# Patient Record
Sex: Female | Born: 1995 | Race: White | Hispanic: No | Marital: Married | Smoking: Current every day smoker
Health system: Southern US, Community
[De-identification: ages and names within clinical notes are randomized; demographics above are authoritative.]

## PROBLEM LIST (undated history)

## (undated) ENCOUNTER — Inpatient Hospital Stay (HOSPITAL_COMMUNITY): Payer: Self-pay

## (undated) DIAGNOSIS — F32A Depression, unspecified: Secondary | ICD-10-CM

## (undated) HISTORY — PX: WISDOM TOOTH EXTRACTION: SHX21

## (undated) HISTORY — PX: TONSILLECTOMY AND ADENOIDECTOMY: SHX28

## (undated) HISTORY — PX: NO PAST SURGERIES: SHX2092

## (undated) HISTORY — DX: Depression, unspecified: F32.A

---
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
## 2017-04-10 DIAGNOSIS — Z83511 Family history of glaucoma: Secondary | ICD-10-CM

## 2017-04-10 DIAGNOSIS — H5203 Hypermetropia, bilateral: Secondary | ICD-10-CM | POA: Insufficient documentation

## 2017-04-10 HISTORY — DX: Hypermetropia, bilateral: H52.03

## 2017-04-10 HISTORY — DX: Family history of glaucoma: Z83.511

## 2018-09-10 ENCOUNTER — Encounter: Payer: Self-pay | Admitting: Family

## 2018-11-13 ENCOUNTER — Ambulatory Visit (INDEPENDENT_AMBULATORY_CARE_PROVIDER_SITE_OTHER): Payer: 59 | Admitting: Family Medicine

## 2018-11-13 ENCOUNTER — Encounter: Payer: Self-pay | Admitting: Family Medicine

## 2018-11-13 ENCOUNTER — Other Ambulatory Visit: Payer: Self-pay

## 2018-11-13 VITALS — BP 112/82 | HR 113 | Temp 98.6°F | Ht 62.0 in | Wt 229.0 lb

## 2018-11-13 DIAGNOSIS — J029 Acute pharyngitis, unspecified: Secondary | ICD-10-CM | POA: Diagnosis not present

## 2018-11-13 LAB — POCT RAPID STREP A (OFFICE): Rapid Strep A Screen: NEGATIVE

## 2018-11-13 NOTE — Patient Instructions (Signed)
Continue to push fluids, practice good hand hygiene, and cover your mouth if you cough.  If you start having fevers, shaking or shortness of breath, seek immediate care.  OK to take Tylenol 1000 mg (2 extra strength tabs) or 975 mg (3 regular strength tabs) every 6 hours as needed.  Ibuprofen 400-600 mg (2-3 over the counter strength tabs) every 6 hours as needed for pain.  Give [place] 2 business days to get results of your culture back.   Let us know if you need anything.

## 2018-11-13 NOTE — Addendum Note (Signed)
Addended by: Scharlene Gloss B on: 11/13/2018 01:35 PM   Modules accepted: Orders

## 2018-11-13 NOTE — Progress Notes (Signed)
poct

## 2018-11-13 NOTE — Progress Notes (Signed)
SUBJECTIVE:   Angela Blackwell is a 23 y.o. female presents to the clinic for:  Chief Complaint  Patient presents with  . Sore Throat    Complains of sore throat for 4 days.  Other associated symptoms: sinus congestion, rhinorrhea, sore throat and neck pain.  Denies: sinus pain, itchy watery eyes, ear fullness, ear pain, ear drainage, wheezing, shortness of breath and fevers, cough Sick Contacts: none known Therapy to date: Mucinex, Nyquil, otc allergy medicine  Social History   Tobacco Use  Smoking Status Never Smoker  Smokeless Tobacco Never Used    ROS: Pertinent items are noted in HPI  Patient's medications, allergies, past medical, surgical, social and family histories were reviewed and updated as appropriate.  OBJECTIVE:  BP 112/82 (BP Location: Left Arm, Patient Position: Sitting, Cuff Size: Large)   Pulse (!) 113   Temp 98.6 F (37 C) (Oral)   Ht 5\' 2"  (1.575 m)   Wt 229 lb (103.9 kg)   SpO2 98%   BMI 41.88 kg/m  General: Awake, alert, appearing stated age Eyes: conjunctivae and sclerae clear Ears: normal TMs bilaterally Nose: no visible exudate Oropharynx: lips, mucosa, and tongue normal; teeth and gums normal and tonsils erythematous without exudate Neck: supple, no significant adenopathy or tenderness Lungs: clear to auscultation, no wheezes, rales or rhonchi, symmetric air entry, normal effort Heart: rate and rhythm regular Skin:reveals no rash Psych: Age appropriate judgment and insight  ASSESSMENT/PLAN:  Sore throat - Plan: POCT rapid strep A  Orders as above. Rapid neg, will cx.  Continue to practice good hand hygiene and push fluids. Ibuprofen and acetaminophen for pain. Pt has hx of strep resistant w PCN, did well with macrolide.  F/u prn. Pt voiced understanding and agreement to the plan.  Jilda Roche [place], DO 11/13/18 1:23 PM

## 2018-11-15 ENCOUNTER — Other Ambulatory Visit: Payer: Self-pay | Admitting: Family Medicine

## 2018-11-15 LAB — CULTURE, GROUP A STREP
MICRO NUMBER: 305819
SPECIMEN QUALITY:: ADEQUATE

## 2018-11-15 MED ORDER — AMOXICILLIN 500 MG PO CAPS: 1000.0000 mg | ORAL_CAPSULE | Freq: Every day | ORAL | 0 refills | Status: AC

## 2019-09-05 HISTORY — PX: TONSILLECTOMY: SUR1361

## 2021-04-25 DIAGNOSIS — R87612 Low grade squamous intraepithelial lesion on cytologic smear of cervix (LGSIL): Secondary | ICD-10-CM | POA: Insufficient documentation

## 2021-04-25 HISTORY — DX: Low grade squamous intraepithelial lesion on cytologic smear of cervix (LGSIL): R87.612

## 2021-04-27 LAB — HM PAP SMEAR

## 2021-05-23 HISTORY — PX: CERVICAL BIOPSY: SHX590

## 2021-10-10 ENCOUNTER — Encounter: Payer: Self-pay | Admitting: *Deleted

## 2021-10-28 ENCOUNTER — Ambulatory Visit (INDEPENDENT_AMBULATORY_CARE_PROVIDER_SITE_OTHER): Payer: No Typology Code available for payment source | Admitting: Family Medicine

## 2021-10-28 ENCOUNTER — Encounter: Payer: Self-pay | Admitting: Family Medicine

## 2021-10-28 ENCOUNTER — Other Ambulatory Visit: Payer: Self-pay

## 2021-10-28 ENCOUNTER — Other Ambulatory Visit: Payer: Self-pay | Admitting: Family Medicine

## 2021-10-28 VITALS — BP 126/83 | HR 89 | Temp 98.1°F | Ht 62.5 in | Wt 266.0 lb

## 2021-10-28 DIAGNOSIS — E669 Obesity, unspecified: Secondary | ICD-10-CM | POA: Insufficient documentation

## 2021-10-28 DIAGNOSIS — Z713 Dietary counseling and surveillance: Secondary | ICD-10-CM | POA: Diagnosis not present

## 2021-10-28 DIAGNOSIS — Z6841 Body Mass Index (BMI) 40.0 and over, adult: Secondary | ICD-10-CM

## 2021-10-28 LAB — VITAMIN D 25 HYDROXY (VIT D DEFICIENCY, FRACTURES): VITD: 28.74 ng/mL — ABNORMAL LOW (ref 30.00–100.00)

## 2021-10-28 LAB — COMPREHENSIVE METABOLIC PANEL
ALT: 21 U/L (ref 0–35)
AST: 20 U/L (ref 0–37)
Albumin: 4 g/dL (ref 3.5–5.2)
Alkaline Phosphatase: 50 U/L (ref 39–117)
BUN: 15 mg/dL (ref 6–23)
CO2: 27 mEq/L (ref 19–32)
Calcium: 9.4 mg/dL (ref 8.4–10.5)
Chloride: 102 mEq/L (ref 96–112)
Creatinine, Ser: 0.81 mg/dL (ref 0.40–1.20)
GFR: 102.44 mL/min (ref >60.00)
Glucose, Bld: 87 mg/dL (ref 70–99)
Potassium: 3.9 mEq/L (ref 3.5–5.1)
Sodium: 137 mEq/L (ref 135–145)
Total Bilirubin: 0.4 mg/dL (ref 0.2–1.2)
Total Protein: 6.8 g/dL (ref 6.0–8.3)

## 2021-10-28 LAB — LIPID PANEL
Cholesterol: 165 mg/dL (ref 0–200)
HDL: 44.5 mg/dL (ref >39.00)
LDL Cholesterol: 97 mg/dL (ref 0–99)
NonHDL: 120.93
Total CHOL/HDL Ratio: 4
Triglycerides: 118 mg/dL (ref 0.0–149.0)
VLDL: 23.6 mg/dL (ref 0.0–40.0)

## 2021-10-28 LAB — HEMOGLOBIN A1C: Hgb A1c MFr Bld: 5.4 % (ref 4.6–6.5)

## 2021-10-28 LAB — TSH: TSH: 1.32 u[IU]/mL (ref 0.35–5.50)

## 2021-10-28 MED ORDER — NALTREXONE HCL 50 MG PO TABS: 25.0000 mg | ORAL_TABLET | Freq: Two times a day (BID) | ORAL | 0 refills | Status: DC

## 2021-10-28 NOTE — Patient Instructions (Addendum)
° ° °  provided with online resources for: Weekly net calorie calculator.  Applications for calorie counting.  ensure she is taking in adequate nutrition daily by meeting calorie goals. dietary changes to not only lose weight but to eat healthy.   Low glycemic index -Patient was educated on exercise goal of 150 minutes a week (plus warm up and cool down) of cardiovascular exercise.  HR 150s water consumption of at least 100 ounces a day, more if exercising/sweating.

## 2021-10-28 NOTE — Progress Notes (Signed)
Patient ID: Angela Blackwell, female  DOB: Nov 14, 1995, 26 y.o.   MRN: [id number] Patient Care Team    Relationship Specialty Notifications Start End  Ma Hillock, DO PCP - General Family Medicine  10/28/21   Kerry Dory, NP Nurse Practitioner Nurse Practitioner  10/28/21 10/28/21   Comment: Carolyne Littles, DO Consulting Physician Obstetrics and Gynecology  10/28/21     Chief Complaint  Patient presents with   Establish Care   Obesity    Pt does have a food journal on phone; 1500-1000 calories a day; and have some moderate activity QOD for 30 mins; start weight 285# today 266#    Subjective: Angela Blackwell is a 26 y.o.  female present for new patient establishment-TOC. All past medical history, surgical history, allergies, family history, immunizations, medications and social history were updated in the electronic medical record today. All recent labs, ED visits and hospitalizations within the last year were reviewed.  Today's weight: 266 lbs.  Patient reports she gained weight with the Nexplanon and has had difficulty taking off weight.  She has been overweight for quite a few years and reports she actually has had quite a bit of weight loss slowly over the last couple years.  He works at [employer] and her days consistently active.  She obtains many steps in her day and is lifting heavy objects.  She does not actively exercise for her weight loss. She has been attempting to track calories and reports approximately 1500 cal a day.  Depression screen [facility] 2/9 10/28/2021  Decreased Interest 0  Down, Depressed, Hopeless 1  PHQ - 2 Score 1  Altered sleeping 0  Tired, decreased energy 0  Change in appetite 1  Feeling bad or failure about yourself  2  Trouble concentrating 0  Moving slowly or fidgety/restless 0  Suicidal thoughts 0  PHQ-9 Score 4  Some encounter information is confidential and restricted. Go to Review Flowsheets activity to see all data.   GAD 7 :  Generalized Anxiety Score 10/28/2021  Nervous, Anxious, on Edge 1  Control/stop worrying 1  Worry too much - different things 1  Trouble relaxing 0  Restless 0  Easily annoyed or irritable 1  Afraid - awful might happen 0  Total GAD 7 Score 4         No flowsheet data found.   Immunization History  Administered Date(s) Administered   HPV Quadrivalent 07/17/2012, 09/16/2012, 01/21/2013   Influenza-Unspecified 06/04/2021   Meningococcal Conjugate 06/07/2015   Moderna Sars-Covid-2 Vaccination 10/23/2019, 11/27/2019, 12/25/2019   Tdap 09/04/2012    No results found.  Past Medical History:  Diagnosis Date   Depression    LGSIL on Pap smear of cervix 04/25/2021   [facility]   Allergies  Allergen Reactions   Hydrocodone-Acetaminophen Other (See Comments)    Anxiety    Oxycodone Other (See Comments)    anxiety   Past Surgical History:  Procedure Laterality Date   CERVICAL BIOPSY  05/23/2021   LGSIL- pap 04/2021   TONSILLECTOMY  2021   TONSILLECTOMY AND ADENOIDECTOMY     WISDOM TOOTH EXTRACTION     Family History  Problem Relation Age of Onset   Hyperlipidemia Mother    Miscarriages / Stillbirths Mother        miscarrige   Hypertension Mother    Osteoarthritis Father    Hypertension Father    Diabetes Father    Hyperlipidemia Father    Depression Father  Mental illness Father    Skin cancer Father    ADD / ADHD Half-Brother    PDD Half-Brother    Bipolar disorder Paternal Aunt    Anxiety disorder Paternal Aunt    Anxiety disorder Paternal Aunt    Depression Paternal Aunt    Arthritis Maternal Grandmother    Breast cancer Maternal Grandmother    Arthritis Maternal Grandfather    Prostate cancer Maternal Grandfather    Mental illness Paternal Grandmother    Hypertension Paternal Grandmother    Hyperlipidemia Paternal Grandmother    Asthma Paternal Grandmother    Arthritis Paternal Grandmother    Diabetes Paternal Grandmother    Depression Paternal  Grandmother    Lung cancer Paternal Grandmother        smoker   Cancer Paternal Grandfather        ?   Lung cancer Paternal Grandfather        smoker   Hyperlipidemia Paternal Grandfather    Hearing loss Paternal Grandfather    Hypertension Paternal Grandfather    COPD Paternal Grandfather    Kidney disease Paternal Grandfather    Heart attack Paternal Grandfather    Social History   Social History Narrative   Marital status/children/pets: Single.    Education/employment: HS grad, employed as Chemical engineer     -smoke alarm in the home:Yes     - wears seatbelt: Yes     - Feels safe in their relationships: Yes             Allergies as of 10/28/2021       Reactions   Hydrocodone-acetaminophen Other (See Comments)   Anxiety    Oxycodone Other (See Comments)   anxiety        Medication List        Accurate as of October 28, 2021  2:59 PM. If you have any questions, ask your nurse or doctor.          clotrimazole-betamethasone cream Commonly known as: LOTRISONE clotrimazole-betamethasone 1 %-0.05 % topical cream   Lo Loestrin Fe 1 MG-10 MCG / 10 MCG tablet Generic drug: Norethindrone-Ethinyl Estradiol-Fe Biphas Take 1 tablet by mouth daily.   naltrexone 50 MG tablet Commonly known as: DEPADE Take 0.5 tablets (25 mg total) by mouth 2 (two) times daily before lunch and supper. Started by: Howard Pouch, DO        All past medical history, surgical history, allergies, family history, immunizations andmedications were updated in the EMR today and reviewed under the history and medication portions of their EMR.    No results found for this or any previous visit (from the past 2160 hour(s)).  No results found.   ROS 14 pt review of systems performed and negative (unless mentioned in an HPI)  Objective:  BP 126/83    Pulse 89    Temp 98.1 F (36.7 C) (Oral)    Ht 5' 2.5" (1.588 m)    Wt 266 lb (120.7 kg)    LMP 10/01/2021    SpO2 100%    BMI 47.88  kg/m  Physical Exam Vitals and nursing note reviewed.  Constitutional:      General: She is not in acute distress.    Appearance: Normal appearance. She is obese. She is not ill-appearing or toxic-appearing.  HENT:     Head: Normocephalic and atraumatic.  Eyes:     Extraocular Movements: Extraocular movements intact.     Conjunctiva/sclera: Conjunctivae normal.     Pupils: Pupils are equal,  round, and reactive to light.  Cardiovascular:     Rate and Rhythm: Normal rate and regular rhythm.     Heart sounds: No murmur heard.   No friction rub. No gallop.  Pulmonary:     Effort: Pulmonary effort is normal. No respiratory distress.     Breath sounds: Normal breath sounds. No wheezing or rales.  Musculoskeletal:        General: Normal range of motion.     Cervical back: Neck supple.  Skin:    General: Skin is warm and dry.     Findings: No rash.  Neurological:     Mental Status: She is alert and oriented to person, place, and time. Mental status is at baseline.  Psychiatric:        Mood and Affect: Mood normal.        Behavior: Behavior normal.        Thought Content: Thought content normal.        Judgment: Judgment normal.      Assessment/plan: Angela Blackwell is a 26 y.o. female present for est care-toc  Return in about 4 weeks (around 11/25/2021) for weight loss. Weight loss counseling, encounter for/Morbid obesity (HCC)/BMI 45.0-49.9, adult ([place]) Patient was counseled on exercise, calorie counting, weight loss and potential medications to help with weight loss today. -Patient was provided with online resources for: Weekly net calorie calculator.  Applications for calorie counting.  Patient was advised to ensure she is taking in adequate nutrition daily by meeting calorie goals. -Patient was educated on dietary changes to not only lose weight but to eat healthy.  Patient was educated on glycemic index.   -Goal: To implement low glycemic choices to her diet.  Currently consuming  pretty healthy diet but high in complex carbohydrates. -Patient was educated on exercise goal of 150 minutes a week (plus warm up and cool down) of cardiovascular exercise.  Patient was educated on heart rate for cardiovascular and fat burning zones.  Heart rate at least in the 150s sustained during this 150 minutes/week. -Patient was encouraged to maintain adequate water consumption of at least 100 ounces a day, more if exercising/sweating. -Depade 25 mg twice daily prescribed to help with cravings and snacking. We discussed laboratory evaluation today to rule out diabetes and other comorbid conditions with obesity. We discussed considering Wegovy depending upon those results. - TSH - Vitamin D (25 hydroxy) - Lipid panel - Comp Met (CMET) - Hemoglobin A1c Follow-up 4 weeks   Meds ordered this encounter  Medications   naltrexone (DEPADE) 50 MG tablet    Sig: Take 0.5 tablets (25 mg total) by mouth 2 (two) times daily before lunch and supper.    Dispense:  90 tablet    Refill:  0   Referral Orders  No referral(s) requested today     Note is dictated utilizing voice recognition software. Although note has been proof read prior to signing, occasional typographical errors still can be missed. If any questions arise, please do not hesitate to call for verification.  Electronically signed by: Howard Pouch, DO [place]

## 2021-10-31 ENCOUNTER — Telehealth: Payer: Self-pay | Admitting: Family Medicine

## 2021-10-31 DIAGNOSIS — Z6841 Body Mass Index (BMI) 40.0 and over, adult: Secondary | ICD-10-CM

## 2021-10-31 DIAGNOSIS — E559 Vitamin D deficiency, unspecified: Secondary | ICD-10-CM | POA: Insufficient documentation

## 2021-10-31 MED ORDER — WEGOVY 0.25 MG/0.5ML ~~LOC~~ SOAJ: 0.2500 mg | SUBCUTANEOUS | 5 refills | Status: DC

## 2021-10-31 NOTE — Telephone Encounter (Signed)
Please inform patient the following information: Liver and kidney function are normal. Cholesterol panel is normal. Vitamin D is mildly low at 28> encouraged her to start over-the-counter 600-800 units of vitamin D3 daily. Thyroid function is normal. Glucose and A1c are normal.   As far as additional medications to help with her weight loss, I have called in [facility] which is once weekly injection.  It may be too expensive and not covered by her insurance, we have to submit it in order to be sure. If injection is covered it is a once weekly injection.  If approved, we will instruct her on appropriate technique with her first dose when she picks up first prescription.  If not improved we will continue with the Depade, exercise and dietary changes.  Follow-up in 4 weeks

## 2021-11-01 NOTE — Telephone Encounter (Signed)
KAISLEY STIVERSON ([facility]) Rx #: [id number] ZSWFUX 0.25MG /0.5ML auto-injectors   Form [employer] Electronic PA Form [phone] NCPDP)

## 2021-11-01 NOTE — Telephone Encounter (Signed)
Spoke with pt regarding labs and instructions.   

## 2021-11-09 ENCOUNTER — Ambulatory Visit (INDEPENDENT_AMBULATORY_CARE_PROVIDER_SITE_OTHER): Payer: No Typology Code available for payment source

## 2021-11-09 ENCOUNTER — Other Ambulatory Visit: Payer: Self-pay

## 2021-11-09 DIAGNOSIS — Z7189 Other specified counseling: Secondary | ICD-10-CM | POA: Diagnosis not present

## 2021-11-09 NOTE — Progress Notes (Signed)
Pt came to office today for injection education. ? ?Per PCP instructions: ?I have called in [facility] which is once weekly injection.  If approved, we will instruct her on appropriate technique with her first dose when she picks up first prescription. ?

## 2021-11-23 ENCOUNTER — Ambulatory Visit (INDEPENDENT_AMBULATORY_CARE_PROVIDER_SITE_OTHER): Payer: No Typology Code available for payment source | Admitting: Family Medicine

## 2021-11-23 ENCOUNTER — Other Ambulatory Visit: Payer: Self-pay

## 2021-11-23 ENCOUNTER — Encounter: Payer: Self-pay | Admitting: Family Medicine

## 2021-11-23 VITALS — BP 126/82 | HR 81 | Temp 98.0°F | Wt 260.6 lb

## 2021-11-23 DIAGNOSIS — Z6841 Body Mass Index (BMI) 40.0 and over, adult: Secondary | ICD-10-CM | POA: Diagnosis not present

## 2021-11-23 DIAGNOSIS — E559 Vitamin D deficiency, unspecified: Secondary | ICD-10-CM | POA: Diagnosis not present

## 2021-11-23 MED ORDER — WEGOVY 0.5 MG/0.5ML ~~LOC~~ SOAJ: 0.5000 mg | SUBCUTANEOUS | 5 refills | Status: DC

## 2021-11-23 NOTE — Progress Notes (Signed)
? ? ? ?Patient ID: Angela Blackwell, female  DOB: [birth year], 26 y.o.   MRN: [id number] ?Patient Care Team  ?  Relationship Specialty Notifications Start End  ?Natalia Leatherwood, DO PCP - General Family Medicine  10/28/21   ?Mitchel Honour, DO Consulting Physician Obstetrics and Gynecology  10/28/21   ? ? ?Chief Complaint  ?Patient presents with  ? Weight Loss  ? ? ?Subjective: ?Angela Blackwell is a 26 y.o.  female present for new patient establishment-TOC. ?All past medical history, surgical history, allergies, family history, immunizations, medications and social history were updated in the electronic medical record today. ?All recent labs, ED visits and hospitalizations within the last year were reviewed. ? ?Start weight: 266 lbs.  ?11/23/2021: 260.6 lbs ? ?Pt started on Wegovy since out last visit.  She is tolerating medication well.  She does admit she has a decreased appetite.  The first injection did cause her nausea but she has been tolerating since.  She has started exercising 3 times a week.  She has made cutbacks on her diet following a low glycemic index. ? ?Prior note: ?Patient reports she gained weight with the Nexplanon and has had difficulty taking off weight.  She has been overweight for quite a few years and reports she actually has had quite a bit of weight loss slowly over the last couple years.  SHe works at [employer] and her days consistently active.  She obtains many steps in her day and is lifting heavy objects.  She does not actively exercise for her weight loss. ?She has been attempting to track calories and reports approximately 1500 cal a day. ? ? ?  11/23/2021  ?  3:47 PM 10/28/2021  ? 11:03 AM 07/15/2013  ?  9:20 AM  ?Depression screen PHQ 2/9  ?Decreased Interest 0 0   ?Down, Depressed, Hopeless 1 1   ?PHQ - 2 Score 1 1   ?Altered sleeping  0   ?Tired, decreased energy  0   ?Change in appetite  1   ?Feeling bad or failure about yourself   2   ?Trouble concentrating  0   ?Moving slowly or  fidgety/restless  0   ?Suicidal thoughts  0   ?PHQ-9 Score  4   ?  ? Information is confidential and restricted. Go to Review Flowsheets to unlock data.  ? ? ?  10/28/2021  ? 11:03 AM  ?GAD 7 : Generalized Anxiety Score  ?Nervous, Anxious, on Edge 1  ?Control/stop worrying 1  ?Worry too much - different things 1  ?Trouble relaxing 0  ?Restless 0  ?Easily annoyed or irritable 1  ?Afraid - awful might happen 0  ?Total GAD 7 Score 4  ? ? ? ?  ?  ?   ? View : No data to display.  ?  ?  ?  ? ? ? ?Immunization History  ?Administered Date(s) Administered  ? HPV Quadrivalent 07/17/2012, 09/16/2012, 01/21/2013  ? Influenza-Unspecified 06/04/2021  ? Meningococcal Conjugate 06/07/2015  ? Moderna Sars-Covid-2 Vaccination 10/23/2019, 11/27/2019, 12/25/2019  ? Tdap 09/04/2012  ? ? ?No results found. ? ?Past Medical History:  ?Diagnosis Date  ? Depression   ? Family history of glaucoma 04/10/2017  ? 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 LGSIL on Pap smear of cervix 04/25/2021  ? [facility]  ? Low grade squamous intraepithelial lesion (LGSIL) on cervicovaginal cytologic smear 04/25/2021  ? MDD (major depressive disorder), recurrent, severe, 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  ? PTSD (post-traumatic stress disorder) 07/18/2012  ? ?  Allergies  ?Allergen Reactions  ? Hydrocodone-Acetaminophen Other (See Comments)  ?  Anxiety   ? Oxycodone Other (See Comments)  ?  anxiety  ? ?Past Surgical History:  ?Procedure Laterality Date  ? CERVICAL BIOPSY  05/23/2021  ? LGSIL- pap 04/2021  ? TONSILLECTOMY  2021  ? TONSILLECTOMY AND ADENOIDECTOMY    ? WISDOM TOOTH EXTRACTION    ? ?Family History  ?Problem Relation Age of Onset  ? Hyperlipidemia Mother   ? Misc[place]riages / [place]   ?     miscarrige  ? Hypertension Mother   ? Osteoarthritis Father   ? Hypertension Father   ? Diabetes Father   ? Hyperlipidemia Father   ? Depression Father   ? Mental illness Father   ? Skin cancer Father   ? ADD / ADHD Half-Brother   ? PDD Half-Brother   ? Bipolar  disorder Paternal Aunt   ? Anxiety disorder Paternal Aunt   ? Anxiety disorder Paternal Aunt   ? Depression Paternal Aunt   ? Arthritis Maternal Grandmother   ? Breast cancer Maternal Grandmother   ? Arthritis Maternal Grandfather   ? Prostate cancer Maternal Grandfather   ? Mental illness Paternal Grandmother   ? Hypertension Paternal Grandmother   ? Hyperlipidemia Paternal Grandmother   ? Asthma Paternal Grandmother   ? Arthritis Paternal Grandmother   ? Diabetes Paternal Grandmother   ? Depression Paternal Grandmother   ? Lung cancer Paternal Grandmother   ?     smoker  ? Cancer Paternal Grandfather   ?     ?  ? Lung cancer Paternal Grandfather   ?     smoker  ? Hyperlipidemia Paternal Grandfather   ? Hearing loss Paternal Grandfather   ? Hypertension Paternal Grandfather   ? COPD Paternal Grandfather   ? Kidney disease Paternal Grandfather   ? Heart attack Paternal Grandfather   ? ?Social History  ? ?Social History Narrative  ? Marital status/children/pets: Single.   ? Education/employment: HS grad, employed as IT sales professionalsales associate  ?   -smoke alarm in the home:Yes  ?   - wears seatbelt: Yes  ?   - Feels safe in their relationships: Yes  ?   ?   ?   ? ? ?Allergies as of 11/23/2021   ? ?   Reactions  ? Hydrocodone-acetaminophen Other (See Comments)  ? Anxiety   ? Oxycodone Other (See Comments)  ? anxiety  ? ?  ? ?  ?Medication List  ?  ? ?  ? Accurate as of November 23, 2021  3:48 PM. If you have any questions, ask your nurse or doctor.  ?  ?  ? ?  ? ?clotrimazole-betamethasone cream ?Commonly known as: LOTRISONE ?clotrimazole-betamethasone 1 %-0.05 % topical cream ?  ?Lo Loestrin Fe 1 MG-10 MCG / 10 MCG tablet ?Generic drug: Norethindrone-Ethinyl Estradiol-Fe Biphas ?Take 1 tablet by mouth daily. ?  ?naltrexone 50 MG tablet ?Commonly known as: DEPADE ?Take 0.5 tablets (25 mg total) by mouth 2 (two) times daily before lunch and supper. ?  ?Wegovy 0.25 MG/0.5ML Soaj ?Generic drug: Semaglutide-Weight Management ?Inject 0.25  mg into the skin once a week. ?  ? ?  ? ? ?All past medical history, surgical history, allergies, family history, immunizations andmedications were updated in the EMR today and reviewed under the history and medication portions of their EMR.    ? ?No results found. ? ? ?ROS ?14 pt review of systems performed and negative (unless mentioned in an  HPI) ? ?Objective: ?BP 126/82   Pulse 81   Temp 98 ?F (36.7 ?C)   Wt 260 lb 9.6 oz (118.2 kg)   SpO2 100%   BMI 46.90 kg/m?  ?Physical Exam ?Vitals and nursing note reviewed.  ?Constitutional:   ?   General: She is not in acute distress. ?   Appearance: Normal appearance. She is obese. She is not ill-appearing or toxic-appearing.  ?HENT:  ?   Head: Normocephalic and atraumatic.  ?Eyes:  ?   Extraocular Movements: Extraocular movements intact.  ?   Conjunctiva/sclera: Conjunctivae normal.  ?   Pupils: Pupils are equal, round, and reactive to light.  ?Musculoskeletal:     ?   General: Normal range of motion.  ?Skin: ?   General: Skin is warm and dry.  ?   Findings: No rash.  ?Neurological:  ?   Mental Status: She is alert and oriented to person, place, and time. Mental status is at baseline.  ?Psychiatric:     ?   Mood and Affect: Mood normal.     ?   Behavior: Behavior normal.     ?   Thought Content: Thought content normal.     ?   Judgment: Judgment normal.  ?  ? ? ?Assessment/plan: ?Sheranda Seabrooks is a 27 y.o. female present for est care-toc ? ?No follow-ups on file. ?Weight loss counseling, encounter for/Morbid obesity (HCC)/BMI 45.0-49.9, adult (HCC) ?She has lost little over 6 pounds in 4 weeks. ?Patient has counseled on exercise, calorie counting, weight loss and potential medications  ?-Patient has provided with online resources for: Weekly net calorie calculator.  Applications for calorie counting.  Patient was advised to ensure she is taking in adequate nutrition daily by meeting calorie goals. ?-Patient has been  educated on dietary changes to not only lose weight  but to eat healthy.  Patient was educated on glycemic index. ?  -Goal: Increase exercise to 150 minutes a week. ?-Patient was educated on exercise goal of 150 minutes a week (plus warm up and cool down) of cardiovascu

## 2021-11-23 NOTE — Patient Instructions (Addendum)
Great job!!! ?Goals: increase exercise.  ?More protein at lunch can help cary you through longer.  ? ? ? ? ? ?

## 2021-11-25 ENCOUNTER — Ambulatory Visit: Payer: No Typology Code available for payment source | Admitting: Family Medicine

## 2022-01-06 ENCOUNTER — Encounter: Payer: Self-pay | Admitting: Family Medicine

## 2022-01-06 ENCOUNTER — Ambulatory Visit (INDEPENDENT_AMBULATORY_CARE_PROVIDER_SITE_OTHER): Payer: No Typology Code available for payment source | Admitting: Family Medicine

## 2022-01-06 DIAGNOSIS — Z713 Dietary counseling and surveillance: Secondary | ICD-10-CM | POA: Diagnosis not present

## 2022-01-06 DIAGNOSIS — Z6841 Body Mass Index (BMI) 40.0 and over, adult: Secondary | ICD-10-CM | POA: Diagnosis not present

## 2022-01-06 HISTORY — DX: Dietary counseling and surveillance: Z71.3

## 2022-01-06 MED ORDER — WEGOVY 1 MG/0.5ML ~~LOC~~ SOAJ: 1.0000 mg | SUBCUTANEOUS | 5 refills | Status: DC

## 2022-01-06 MED ORDER — NALTREXONE HCL 50 MG PO TABS: 25.0000 mg | ORAL_TABLET | Freq: Two times a day (BID) | ORAL | 1 refills | Status: DC

## 2022-01-06 MED ORDER — CLOTRIMAZOLE 1 % EX CREA: 1.0000 "application " | TOPICAL_CREAM | Freq: Two times a day (BID) | CUTANEOUS | 5 refills | Status: DC

## 2022-01-06 MED ORDER — LO LOESTRIN FE 1 MG-10 MCG / 10 MCG PO TABS: 1.0000 | ORAL_TABLET | Freq: Every day | ORAL | 4 refills | Status: DC

## 2022-01-06 NOTE — Patient Instructions (Signed)
Great job. Stay STRONG!!! ? ?Increase exercise. AT least 150 minutes a week.  ? ? ?

## 2022-01-06 NOTE — Progress Notes (Signed)
? ? ? ?Patient ID: Angela DallasKylee Polansky, female  DOB: 1996-04-08, 26 y.o.   MRN: [id number] ?Patient Care Team  ?  Relationship Specialty Notifications Start End  ?Natalia LeatherwoodKuneff, Rajean Desantiago A, DO PCP - General Family Medicine  10/28/21   ?Mitchel HonourMorris, Megan, DO Consulting Physician Obstetrics and Gynecology  10/28/21   ? ? ?Chief Complaint  ?Patient presents with  ? Weight Check  ?  Pt is not fasting  ? ? ?Subjective: ?Angela Blackwell is a 26 y.o.  female present for  ?Start weight: 266 lbs.  ?11/23/2021: 260.6 lbs ?01/07/2023: 250 lbs ? ?[facility]  She is now lost about 16 pounds since the start of her journey.  She is exercising more, at about 120 minutes a week.  She reports she is starting to feel the difference since the weight loss.  She is no longer out of breath with activity.  She states it feels good to be able to climb the ladder at work and not feel out of breath when doing so.  She has altered her diet and motivated to stick to it. ? ?Prior note ?Pt started on Wegovy since out last visit.  She is tolerating medication well.  She does admit she has a decreased appetite.  The first injection did cause her nausea but she has been tolerating since.  She has started exercising 3 times a week.  She has made cutbacks on her diet following a low glycemic index. ? ?Prior note: ?Patient reports she gained weight with the Nexplanon and has had difficulty taking off weight.  She has been overweight for quite a few years and reports she actually has had quite a bit of weight loss slowly over the last couple years.  SHe works at [employer] and her days consistently active.  She obtains many steps in her day and is lifting heavy objects.  She does not actively exercise for her weight loss. ?She has been attempting to track calories and reports approximately 1500 cal a day. ? ? ?  11/23/2021  ?  3:47 PM 10/28/2021  ? 11:03 AM 07/15/2013  ?  9:20 AM  ?Depression screen PHQ 2/9  ?Decreased Interest 0 0   ?Down,  Depressed, Hopeless 1 1   ?PHQ - 2 Score 1 1   ?Altered sleeping  0   ?Tired, decreased energy  0   ?Change in appetite  1   ?Feeling bad or failure about yourself   2   ?Trouble concentrating  0   ?Moving slowly or fidgety/restless  0   ?Suicidal thoughts  0   ?PHQ-9 Score  4   ?  ? Information is confidential and restricted. Go to Review Flowsheets to unlock data.  ? ? ?  10/28/2021  ? 11:03 AM  ?GAD 7 : Generalized Anxiety Score  ?Nervous, Anxious, on Edge 1  ?Control/stop worrying 1  ?Worry too much - different things 1  ?Trouble relaxing 0  ?Restless 0  ?Easily annoyed or irritable 1  ?Afraid - awful might happen 0  ?Total GAD 7 Score 4  ? ? ? ?  ?  ?   ? View : No data to display.  ?  ?  ?  ? ? ? ?Immunization History  ?Administered Date(s) Administered  ? HPV Quadrivalent 07/17/2012, 09/16/2012, 01/21/2013  ? Influenza-Unspecified 06/04/2021  ? Meningococcal Conjugate 06/07/2015  ? Moderna Sars-Covid-2 Vaccination 10/23/2019, 11/27/2019, 12/25/2019  ? Tdap 09/04/2012  ? ? ?No results found. ? ?  Past Medical History:  ?Diagnosis Date  ? Depression   ? Family history of glaucoma 04/10/2017  ? 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 LGSIL on Pap smear of cervix 04/25/2021  ? [facility]  ? Low grade squamous intraepithelial lesion (LGSIL) on cervicovaginal cytologic smear 04/25/2021  ? MDD (major depressive disorder), recurrent, severe, 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  ? PTSD (post-traumatic stress disorder) 07/18/2012  ? ?Allergies  ?Allergen Reactions  ? Hydrocodone-Acetaminophen Other (See Comments)  ?  Anxiety   ? Oxycodone Other (See Comments)  ?  anxiety  ? ?Past Surgical History:  ?Procedure Laterality Date  ? CERVICAL BIOPSY  05/23/2021  ? LGSIL- pap 04/2021  ? TONSILLECTOMY  2021  ? TONSILLECTOMY AND ADENOIDECTOMY    ? WISDOM TOOTH EXTRACTION    ? ?Family History  ?Problem Relation Age of Onset  ? Hyperlipidemia Mother   ? Miscarriages / [place] Mother   ?     miscarrige  ? Hypertension Mother   ?  Osteoarthritis Father   ? Hypertension Father   ? Diabetes Father   ? Hyperlipidemia Father   ? Depression Father   ? Mental illness Father   ? Skin cancer Father   ? ADD / ADHD Half-Brother   ? PDD Half-Brother   ? Bipolar disorder Paternal Aunt   ? Anxiety disorder Paternal Aunt   ? Anxiety disorder Paternal Aunt   ? Depression Paternal Aunt   ? Arthritis Maternal Grandmother   ? Breast cancer Maternal Grandmother   ? Arthritis Maternal Grandfather   ? Prostate cancer Maternal Grandfather   ? Mental illness Paternal Grandmother   ? Hypertension Paternal Grandmother   ? Hyperlipidemia Paternal Grandmother   ? Asthma Paternal Grandmother   ? Arthritis Paternal Grandmother   ? Diabetes Paternal Grandmother   ? Depression Paternal Grandmother   ? Lung cancer Paternal Grandmother   ?     smoker  ? Cancer Paternal Grandfather   ?     ?  ? Lung cancer Paternal Grandfather   ?     smoker  ? Hyperlipidemia Paternal Grandfather   ? Hearing loss Paternal Grandfather   ? Hypertension Paternal Grandfather   ? COPD Paternal Grandfather   ? Kidney disease Paternal Grandfather   ? Heart attack Paternal Grandfather   ? ?Social History  ? ?Social History Narrative  ? Marital status/children/pets: Single.   ? Education/employment: HS grad, employed as IT sales professional  ?   -smoke alarm in the home:Yes  ?   - wears seatbelt: Yes  ?   - Feels safe in their relationships: Yes  ?   ?   ?   ? ? ?Allergies as of 01/06/2022   ? ?   Reactions  ? Hydrocodone-acetaminophen Other (See Comments)  ? Anxiety   ? Oxycodone Other (See Comments)  ? anxiety  ? ?  ? ?  ?Medication List  ?  ? ?  ? Accurate as of Jan 06, 2022  6:05 PM. If you have any questions, ask your nurse or doctor.  ?  ?  ? ?  ? ?STOP taking these medications   ? ?Wegovy 0.25 MG/0.5ML Soaj ?Generic drug: Semaglutide-Weight Management ?Replaced by: Wegovy 1 MG/0.5ML Soaj ?Stopped by: Felix Pacini, DO ?  ?Wegovy 0.5 MG/0.5ML Soaj ?Generic drug: Semaglutide-Weight Management ?Stopped by:  Felix Pacini, DO ?  ? ?  ? ?TAKE these medications   ? ?clotrimazole 1 % cream ?Commonly known as: LOTRIMIN ?Apply 1 application. topically 2 (two)  times daily. ?Started by: Felix Pacini, DO ?  ?clotrimazole-betamethasone cream ?Commonly known as: LOTRISONE ?clotrimazole-betamethasone 1 %-0.05 % topical cream ?  ?Lo Loestrin Fe 1 MG-10 MCG / 10 MCG tablet ?Generic drug: Norethindrone-Ethinyl Estradiol-Fe Biphas ?Take 1 tablet by mouth daily. ?  ?naltrexone 50 MG tablet ?Commonly known as: DEPADE ?Take 0.5 tablets (25 mg total) by mouth 2 (two) times daily before lunch and supper. ?  ?Wegovy 1 MG/0.5ML Soaj ?Generic drug: Semaglutide-Weight Management ?Inject 1 mg into the skin once a week. ?Replaces: Wegovy 0.25 MG/0.5ML Soaj ?Started by: Felix Pacini, DO ?  ? ?  ? ? ?All past medical history, surgical history, allergies, family history, immunizations andmedications were updated in the EMR today and reviewed under the history and medication portions of their EMR.    ? ?No results found. ? ? ?ROS ?14 pt review of systems performed and negative (unless mentioned in an HPI) ? ?Objective: ?BP 114/76   Pulse 100   Temp 99.2 ?F (37.3 ?C) (Oral)   Ht 5' 2.5" (1.588 m)   Wt 250 lb (113.4 kg)   LMP 12/25/2021   SpO2 100%   BMI 45.00 kg/m?  ?Physical Exam ?Vitals and nursing note reviewed.  ?Constitutional:   ?   General: She is not in acute distress. ?   Appearance: Normal appearance. She is obese. She is not ill-appearing or toxic-appearing.  ?HENT:  ?   Head: Normocephalic and atraumatic.  ?Eyes:  ?   Extraocular Movements: Extraocular movements intact.  ?   Conjunctiva/sclera: Conjunctivae normal.  ?   Pupils: Pupils are equal, round, and reactive to light.  ?Musculoskeletal:     ?   General: Normal range of motion.  ?Skin: ?   General: Skin is warm and dry.  ?   Findings: No rash.  ?Neurological:  ?   Mental Status: She is alert and oriented to person, place, and time. Mental status is at baseline.  ?Psychiatric:      ?   Mood and Affect: Mood normal.     ?   Behavior: Behavior normal.     ?   Thought Content: Thought content normal.     ?   Judgment: Judgment normal.  ?  ? ? ?Assessment/plan: ?Latarra Eagleton is a 26 y.o. female pre

## 2022-02-15 ENCOUNTER — Encounter: Payer: Self-pay | Admitting: Family Medicine

## 2022-02-15 ENCOUNTER — Ambulatory Visit (INDEPENDENT_AMBULATORY_CARE_PROVIDER_SITE_OTHER): Payer: No Typology Code available for payment source | Admitting: Family Medicine

## 2022-02-15 DIAGNOSIS — Z6841 Body Mass Index (BMI) 40.0 and over, adult: Secondary | ICD-10-CM

## 2022-02-15 DIAGNOSIS — E559 Vitamin D deficiency, unspecified: Secondary | ICD-10-CM

## 2022-02-15 DIAGNOSIS — Z713 Dietary counseling and surveillance: Secondary | ICD-10-CM

## 2022-02-15 MED ORDER — WEGOVY 1.7 MG/0.75ML ~~LOC~~ SOAJ: 1.7000 mg | SUBCUTANEOUS | 5 refills | Status: DC

## 2022-02-15 NOTE — Progress Notes (Signed)
Patient ID: Angela Blackwell, female  DOB: 04-26-1996, 26 y.o.   MRN: [id number] Patient Care Team    Relationship Specialty Notifications Start End  Natalia Leatherwood, DO PCP - General Family Medicine  10/28/21   Mitchel Honour, DO Consulting Physician Obstetrics and Gynecology  10/28/21     Chief Complaint  Patient presents with   Obesity    Subjective: Angela Blackwell is a 26 y.o.  female present for  Start weight: 266 lbs.  11/23/2021: 260.6 lbs 01/07/2023: 250 lbs 02/15/2022: 240 Patient reports she is feeling well.  She is tolerating increased dose of Wegovy of 1 mg weekly injections.  She will be on her fifth injection of this dose at the end of this week.  She is now down a total of 26 pounds.  She has increased her exercise and is hitting the benchmark of at least 150 minutes a week.  She is meal prepping.  She is still taking in high glycemic index carbohydrates. Prior note: Patient reports she is doing great with the [facility].  She is now lost about 16 pounds since the start of her journey.  She is exercising more, at about 120 minutes a week.  She reports she is starting to feel the difference since the weight loss.  She is no longer out of breath with activity.  She states it feels good to be able to climb the ladder at work and not feel out of breath when doing so.  She has altered her diet and motivated to stick to it.  Prior note Pt started on Wegovy since out last visit.  She is tolerating medication well.  She does admit she has a decreased appetite.  The first injection did cause her nausea but she has been tolerating since.  She has started exercising 3 times a week.  She has made cutbacks on her diet following a low glycemic index.  Prior note: Patient reports she gained weight with the Nexplanon and has had difficulty taking off weight.  She has been overweight for quite a few years and reports she actually has had quite a bit of weight loss slowly over the last couple years.  SHe  works at [employer] and her days consistently active.  She obtains many steps in her day and is lifting heavy objects.  She does not actively exercise for her weight loss. She has been attempting to track calories and reports approximately 1500 cal a day.     11/23/2021    3:47 PM 10/28/2021   11:03 AM 07/15/2013    9:20 AM  Depression screen PHQ 2/9  Decreased Interest 0 0   Down, Depressed, Hopeless 1 1   PHQ - 2 Score 1 1   Altered sleeping  0   Tired, decreased energy  0   Change in appetite  1   Feeling bad or failure about yourself   2   Trouble concentrating  0   Moving slowly or fidgety/restless  0   Suicidal thoughts  0   PHQ-9 Score  4      Information is confidential and restricted. Go to Review Flowsheets to unlock data.      10/28/2021   11:03 AM  GAD 7 : Generalized Anxiety Score  Nervous, Anxious, on Edge 1  Control/stop worrying 1  Worry too much - different things 1  Trouble relaxing 0  Restless 0  Easily annoyed or irritable 1  Afraid - awful might happen 0  Total GAD 7 Score 4             No data to display           Immunization History  Administered Date(s) Administered   HPV Quadrivalent 07/17/2012, 09/16/2012, 01/21/2013   Influenza-Unspecified 06/04/2021   Meningococcal Conjugate 06/07/2015   Moderna Sars-Covid-2 Vaccination 10/23/2019, 11/27/2019, 12/25/2019   Tdap 09/04/2012    No results found.  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   PTSD (post-traumatic stress disorder) 07/18/2012   Allergies  Allergen Reactions   Hydrocodone-Acetaminophen Other (See Comments)    Anxiety    Oxycodone Other (See  Comments)    anxiety   Past Surgical History:  Procedure Laterality Date   CERVICAL BIOPSY  05/23/2021   LGSIL- pap 04/2021   TONSILLECTOMY  2021   TONSILLECTOMY AND ADENOIDECTOMY     WISDOM TOOTH EXTRACTION     Family History  Problem Relation Age of Onset   Hyperlipidemia Mother    Miscarriages / Stillbirths Mother        miscarrige   Hypertension Mother    Osteoarthritis Father    Hypertension Father    Diabetes Father    Hyperlipidemia Father    Depression Father    Mental illness Father    Skin cancer Father    ADD / ADHD Half-Brother    PDD Half-Brother    Bipolar disorder Paternal Aunt    Anxiety disorder Paternal Aunt    Anxiety disorder Paternal Aunt    Depression Paternal Aunt    Arthritis Maternal Grandmother    Breast cancer Maternal Grandmother    Arthritis Maternal Grandfather    Prostate cancer Maternal Grandfather    Mental illness Paternal Grandmother    Hypertension Paternal Grandmother    Hyperlipidemia Paternal Grandmother    Asthma Paternal Grandmother    Arthritis Paternal Grandmother    Diabetes Paternal Grandmother    Depression Paternal Grandmother    Lung cancer Paternal Grandmother        smoker   Cancer Paternal Grandfather        ?   Lung cancer Paternal Grandfather        smoker   Hyperlipidemia Paternal Grandfather    Hearing loss Paternal Grandfather    Hypertension Paternal Grandfather    COPD Paternal Grandfather    Kidney disease Paternal Grandfather    Heart attack Paternal Grandfather    Social History   Social History Narrative   Marital status/children/pets: Single.    Education/employment: HS grad, employed as IT sales professionalsales associate     -smoke alarm in the home:Yes     - wears seatbelt: Yes     - Feels safe in their relationships: Yes             Allergies as of 02/15/2022       Reactions   Hydrocodone-acetaminophen Other (See Comments)   Anxiety    Oxycodone Other (See Comments)   anxiety        Medication  List        Accurate as of February 15, 2022  5:08 PM. If you have any questions, ask your nurse or doctor.  STOP taking these medications    Wegovy 1 MG/0.5ML Soaj Generic drug: Semaglutide-Weight Management Replaced by: Wegovy 1.7 MG/0.75ML Soaj Stopped by: Felix Pacini, DO       TAKE these medications    clotrimazole 1 % cream Commonly known as: LOTRIMIN Apply 1 application. topically 2 (two) times daily.   clotrimazole-betamethasone cream Commonly known as: LOTRISONE clotrimazole-betamethasone 1 %-0.05 % topical cream   Lo Loestrin Fe 1 MG-10 MCG / 10 MCG tablet Generic drug: Norethindrone-Ethinyl Estradiol-Fe Biphas Take 1 tablet by mouth daily.   naltrexone 50 MG tablet Commonly known as: DEPADE Take 0.5 tablets (25 mg total) by mouth 2 (two) times daily before lunch and supper.   Wegovy 1.7 MG/0.75ML Soaj Generic drug: Semaglutide-Weight Management Inject 1.7 mg into the skin once a week. Replaces: Wegovy 1 MG/0.5ML Soaj Started by: Felix Pacini, DO        All past medical history, surgical history, allergies, family history, immunizations andmedications were updated in the EMR today and reviewed under the history and medication portions of their EMR.     No results found.   ROS 14 pt review of systems performed and negative (unless mentioned in an HPI)  Objective: BP 137/82   Pulse 92   Temp (!) 97.5 F (36.4 C) (Oral)   Ht 5' 2.5" (1.588 m)   Wt 240 lb (108.9 kg)   SpO2 100%   BMI 43.20 kg/m  Physical Exam Vitals and nursing note reviewed.  Constitutional:      General: She is not in acute distress.    Appearance: Normal appearance. She is obese. She is not ill-appearing or toxic-appearing.  Eyes:     Extraocular Movements: Extraocular movements intact.     Conjunctiva/sclera: Conjunctivae normal.     Pupils: Pupils are equal, round, and reactive to light.  Musculoskeletal:     Right lower leg: No edema.     Left lower leg: No  edema.  Neurological:     Mental Status: She is alert and oriented to person, place, and time. Mental status is at baseline.  Psychiatric:        Mood and Affect: Mood normal.        Behavior: Behavior normal.        Thought Content: Thought content normal.        Judgment: Judgment normal.      Assessment/plan: Angela Blackwell is a 26 y.o. female present for est care-toc  Return in about 7 weeks (around 04/05/2022) for Routine chronic condition follow-up. Weight loss counseling, encounter for/Morbid obesity (HCC)/BMI 45.0-49.9, adult [facility]) She has lost 26 pounds! Patient has been counseled on exercise, calorie counting, weight loss and potential medications  -Patient has been provided with online resources for: Weekly net calorie calculator.  Applications for calorie counting.  Patient was advised to ensure she is taking in adequate nutrition daily by meeting calorie goals. -Patient has been  educated on dietary changes to not only lose weight but to eat healthy.  Patient was educated on glycemic index.  She was reminded again low glycemic index avoid Pasta!  Stick with lean meats, veggies and fruits low glycemic index she is gaining confidence and sticking to her diet.  She is now meal prepping. -Patient was educated on exercise goal of 150 minutes a week (plus warm up and cool down) of cardiovascular exercise.  Patient was educated on heart rate for cardiovascular and fat burning zones.  Heart rate at least in the 150s sustained during this 150  minutes/week. -Patient was encouraged to maintain adequate water consumption of at least 100 ounces a day, more if exercising/sweating. - Continue Depade 25 mg twice daily prescribed to help with cravings and snacking.  - TSH, MD, lipids, CMP and A1c are normal -Wegovy increased to 1.7 mg weekly Follow-up 8 weeks   Meds ordered this encounter  Medications   DISCONTD: Semaglutide-Weight Management (WEGOVY) 1.7 MG/0.75ML SOAJ    Sig: Inject 1.7 mg  into the skin once a week.    Dispense:  3 mL    Refill:  5   WEGOVY 1.7 MG/0.75ML SOAJ    Sig: Inject 1.7 mg into the skin once a week.    Dispense:  3 mL    Refill:  5    DC lower doses please.   Referral Orders  No referral(s) requested today     Note is dictated utilizing voice recognition software. Although note has been proof read prior to signing, occasional typographical errors still can be missed. If any questions arise, please do not hesitate to call for verification.  Electronically signed by: Felix Pacini, DO [facility]

## 2022-02-17 ENCOUNTER — Ambulatory Visit: Payer: No Typology Code available for payment source | Admitting: Family Medicine

## 2022-04-03 ENCOUNTER — Encounter: Payer: Self-pay | Admitting: Family Medicine

## 2022-04-03 ENCOUNTER — Telehealth (INDEPENDENT_AMBULATORY_CARE_PROVIDER_SITE_OTHER): Payer: No Typology Code available for payment source | Admitting: Family Medicine

## 2022-04-03 DIAGNOSIS — U071 COVID-19: Secondary | ICD-10-CM

## 2022-04-03 MED ORDER — ONDANSETRON HCL 4 MG PO TABS: 4.0000 mg | ORAL_TABLET | Freq: Three times a day (TID) | ORAL | 0 refills | Status: DC | PRN

## 2022-04-03 MED ORDER — MOLNUPIRAVIR EUA 200MG CAPSULE: 4.0000 | ORAL_CAPSULE | Freq: Two times a day (BID) | ORAL | 0 refills | Status: AC

## 2022-04-03 MED ORDER — BENZONATATE 200 MG PO CAPS: 200.0000 mg | ORAL_CAPSULE | Freq: Two times a day (BID) | ORAL | 0 refills | Status: DC | PRN

## 2022-04-03 NOTE — Progress Notes (Signed)
VIRTUAL VISIT VIA VIDEO  I connected with Angela Blackwell on 04/03/22 at 10:40 AM EDT by a video enabled telemedicine application and verified that I am speaking with the correct person using two identifiers. Location patient: Home Location provider: [facility], Office Persons participating in the virtual visit: Patient, Dr. Claiborne Billings and Les Pou, CMA  I discussed the limitations of evaluation and management by telemedicine and the availability of in person appointments. The patient expressed understanding and agreed to proceed.    Angela Blackwell , 1996/04/03, 26 y.o., female MRN: [id number] Patient Care Team    Relationship Specialty Notifications Start End  Natalia Leatherwood, DO PCP - General Family Medicine  10/28/21   Mitchel Honour, DO Consulting Physician Obstetrics and Gynecology  10/28/21     Chief Complaint  Patient presents with   Covid Positive    Pt c/o sore throat, body aches, cough, fatigue x 5; pt tested pos for covid on 07/31     Subjective: Pt presents for an OV with complaints of covid + test at home after about 4-5 days of symptom onset. Pt has tried Mucinex, allergy, tylenol to ease their symptoms.      11/23/2021    3:47 PM 10/28/2021   11:03 AM 07/15/2013    9:20 AM  Depression screen PHQ 2/9  Decreased Interest 0 0   Down, Depressed, Hopeless 1 1   PHQ - 2 Score 1 1   Altered sleeping  0   Tired, decreased energy  0   Change in appetite  1   Feeling bad or failure about yourself   2   Trouble concentrating  0   Moving slowly or fidgety/restless  0   Suicidal thoughts  0   PHQ-9 Score  4      Information is confidential and restricted. Go to Review Flowsheets to unlock data.    Allergies  Allergen Reactions   Hydrocodone-Acetaminophen Other (See Comments)    Anxiety    Oxycodone Other (See Comments)    anxiety   Social History   Social History Narrative   Marital status/children/pets: Single.    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   PTSD (post-traumatic stress disorder) 07/18/2012   Past Surgical History:  Procedure Laterality Date   CERVICAL BIOPSY  05/23/2021   LGSIL- pap 04/2021   TONSILLECTOMY  2021   TONSILLECTOMY AND ADENOIDECTOMY     WISDOM TOOTH EXTRACTION     Family History  Problem Relation Age of Onset   Hyperlipidemia Mother    Miscarriages / Stillbirths Mother        miscarrige   Hypertension Mother    Osteoarthritis Father    Hypertension Father    Diabetes Father    Hyperlipidemia Father    Depression Father    Mental illness Father    Skin cancer Father    ADD / ADHD  Half-Brother    PDD Half-Brother    Bipolar disorder Paternal Aunt    Anxiety disorder Paternal Aunt    Anxiety disorder Paternal Aunt    Depression Paternal Aunt    Arthritis Maternal Grandmother    Breast cancer Maternal Grandmother    Arthritis Maternal Grandfather    Prostate cancer Maternal Grandfather    Mental illness Paternal Grandmother    Hypertension Paternal Grandmother    Hyperlipidemia Paternal Grandmother    Asthma Paternal Grandmother    Arthritis Paternal Grandmother    Diabetes Paternal Grandmother    Depression Paternal Grandmother    Lung cancer Paternal Grandmother        smoker   Cancer Paternal Grandfather        ?   Lung cancer Paternal Grandfather        smoker   Hyperlipidemia Paternal Grandfather    Hearing loss Paternal Grandfather    Hypertension Paternal Grandfather    COPD Paternal  Grandfather    Kidney disease Paternal Grandfather    Heart attack Paternal Grandfather    Allergies as of 04/03/2022       Reactions   Hydrocodone-acetaminophen Other (See Comments)   Anxiety    Oxycodone Other (See Comments)   anxiety        Medication List        Accurate as of April 03, 2022 11:27 AM. If you have any questions, ask your nurse or doctor.          benzonatate 200 MG capsule Commonly known as: TESSALON Take 1 capsule (200 mg total) by mouth 2 (two) times daily as needed for cough. Started by: Felix Pacini, DO   clotrimazole 1 % cream Commonly known as: LOTRIMIN Apply 1 application. topically 2 (two) times daily.   clotrimazole-betamethasone cream Commonly known as: LOTRISONE clotrimazole-betamethasone 1 %-0.05 % topical cream   Lo Loestrin Fe 1 MG-10 MCG / 10 MCG tablet Generic drug: Norethindrone-Ethinyl Estradiol-Fe Biphas Take 1 tablet by mouth daily.   molnupiravir EUA 200 mg Caps capsule Commonly known as: LAGEVRIO Take 4 capsules (800 mg total) by mouth 2 (two) times daily for 5 days. Started by: Felix Pacini, DO   naltrexone 50 MG tablet Commonly known as: DEPADE Take 0.5 tablets (25 mg total) by mouth 2 (two) times daily before lunch and supper.   ondansetron 4 MG tablet Commonly known as: ZOFRAN Take 1 tablet (4 mg total) by mouth every 8 (eight) hours as needed for nausea or vomiting. Started by: Felix Pacini, DO   Wegovy 1.7 MG/0.75ML Soaj Generic drug: Semaglutide-Weight Management Inject 1.7 mg into the skin once a week.        All past medical history, surgical history, allergies, family history, immunizations andmedications were updated in the EMR today and reviewed under the history and medication portions of their EMR.     Review of Systems  Constitutional:  Positive for fever and malaise/fatigue. Negative for chills and diaphoresis.  HENT:  Positive for congestion, sinus pain and sore throat. Negative for ear  discharge, ear pain and nosebleeds.   Eyes:  Negative for pain and discharge.  Respiratory:  Positive for cough and sputum production. Negative for shortness of breath and wheezing.   Gastrointestinal:  Positive for diarrhea, nausea and vomiting.  Musculoskeletal:  Positive for myalgias.  Skin:  Negative for itching and rash.  Neurological:  Positive for headaches.   Negative, with the exception of above mentioned in HPI   Objective:  LMP 03/23/2022  There is no height or weight on file to calculate BMI. Physical Exam Vitals and nursing note reviewed.  Constitutional:      General: She is not in acute distress.    Appearance: Normal appearance. She is normal weight. She is not ill-appearing or toxic-appearing.  Eyes:     Extraocular Movements: Extraocular movements intact.     Conjunctiva/sclera: Conjunctivae normal.     Pupils: Pupils are equal, round, and reactive to light.  Pulmonary:     Effort: Pulmonary effort is normal.  Skin:    Findings: No rash.  Neurological:     Mental Status: She is alert and oriented to person, place, and time. Mental status is at baseline.  Psychiatric:        Mood and Affect: Mood normal.        Behavior: Behavior normal.        Thought Content: Thought content normal.        Judgment: Judgment normal.     No results found. No results found. No results found for this or any previous visit (from the past 24 hour(s)).  Assessment/Plan: Tineka Uriegas is a 26 y.o. female present for OV for  COVID-19 virus infection Rest, hydrate.  +/- flonase, mucinex (DM if cough), nettie pot or nasal saline.  Molnuprivir, tessalon perles, zofran prescribed, take until completed.  If cough present it can last up to 6-8 weeks.  Pt will be set up for covid testing in office per employer request F/U 2 weeks if not improved.  Reviewed home care instructions for COVID. Advised self-isolation at home for at least 5 days. After 5 days, if improved and fever  resolved, can be in public, but should wear a mask around others for an additional 5 days. If symptoms, esp, dyspnea develops/worsens, recommend in-person evaluation at either an urgent care or the emergency room.    Reviewed expectations re: course of current medical issues. Discussed self-management of symptoms. Outlined signs and symptoms indicating need for more acute intervention. Patient verbalized understanding and all questions were answered. Patient received an After-Visit Summary.    No orders of the defined types were placed in this encounter.  Meds ordered this encounter  Medications   molnupiravir EUA (LAGEVRIO) 200 mg CAPS capsule    Sig: Take 4 capsules (800 mg total) by mouth 2 (two) times daily for 5 days.    Dispense:  40 capsule    Refill:  0   ondansetron (ZOFRAN) 4 MG tablet    Sig: Take 1 tablet (4 mg total) by mouth every 8 (eight) hours as needed for nausea or vomiting.    Dispense:  20 tablet    Refill:  0   benzonatate (TESSALON) 200 MG capsule    Sig: Take 1 capsule (200 mg total) by mouth 2 (two) times daily as needed for cough.    Dispense:  20 capsule    Refill:  0   Referral Orders  No referral(s) requested today     Note is dictated utilizing voice recognition software. Although note has been proof read prior to signing, occasional typographical errors still can be missed. If any questions arise, please do not hesitate to call for verification.   electronically signed by:  Felix Pacini, DO  [facility] [place]

## 2022-04-03 NOTE — Patient Instructions (Signed)
10 Things You Can Do to Manage Your COVID-19 Symptoms at Home ?If you have possible or confirmed COVID-19 ?Stay home except to get medical care. ?Monitor your symptoms carefully. If your symptoms get worse, call your healthcare provider immediately. ?Get rest and stay hydrated. ?If you have a medical appointment, call the healthcare provider ahead of time and tell them that you have or may have COVID-19. ?For medical emergencies, call 911 and notify the dispatch personnel that you have or may have COVID-19. ?Cover your cough and sneezes with a tissue or use the inside of your elbow. ?Wash your hands often with soap and water for at least 20 seconds or clean your hands with an alcohol-based hand sanitizer that contains at least 60% alcohol. ?As much as possible, stay in a specific room and away from other people in your home. Also, you should use a separate bathroom, if available. If you need to be around other people in or outside of the home, wear a mask. ?Avoid sharing personal items with other people in your household, like dishes, towels, and bedding. ?Clean all surfaces that are touched often, like counters, tabletops, and doorknobs. Use household cleaning sprays or wipes according to the label instructions. ?cdc.gov/coronavirus ?03/19/2020 ?This information is not intended to replace advice given to you by your health care provider. Make sure you discuss any questions you have with your health care provider. ?Document Revised: 05/13/2021 Document Reviewed: 05/13/2021 ?Elsevier Patient Education ? 2022 [employer] ? ?

## 2022-04-07 ENCOUNTER — Ambulatory Visit: Payer: No Typology Code available for payment source | Admitting: Family Medicine

## 2022-04-14 ENCOUNTER — Ambulatory Visit (INDEPENDENT_AMBULATORY_CARE_PROVIDER_SITE_OTHER): Payer: No Typology Code available for payment source | Admitting: Family Medicine

## 2022-04-14 ENCOUNTER — Encounter: Payer: Self-pay | Admitting: Family Medicine

## 2022-04-14 VITALS — BP 104/71 | HR 91 | Temp 98.2°F | Ht 62.5 in | Wt 220.0 lb

## 2022-04-14 DIAGNOSIS — E669 Obesity, unspecified: Secondary | ICD-10-CM

## 2022-04-14 DIAGNOSIS — Z713 Dietary counseling and surveillance: Secondary | ICD-10-CM | POA: Diagnosis not present

## 2022-04-14 MED ORDER — ALBUTEROL SULFATE HFA 108 (90 BASE) MCG/ACT IN AERS: 2.0000 | INHALATION_SPRAY | Freq: Four times a day (QID) | RESPIRATORY_TRACT | 0 refills | Status: DC | PRN

## 2022-04-14 MED ORDER — NALTREXONE HCL 50 MG PO TABS: 25.0000 mg | ORAL_TABLET | Freq: Two times a day (BID) | ORAL | 1 refills | Status: DC

## 2022-04-14 NOTE — Progress Notes (Signed)
Patient ID: Angela Blackwell, female  DOB: 1995/11/19, 26 y.o.   MRN: [id number] Patient Care Team    Relationship Specialty Notifications Start End  Natalia Leatherwood, DO PCP - General Family Medicine  10/28/21   Mitchel Honour, DO Consulting Physician Obstetrics and Gynecology  10/28/21     Chief Complaint  Patient presents with   Obesity    Subjective: Angela Blackwell is a 26 y.o.  female present for  Start weight: 266 lbs.  11/23/2021: 260.6 lbs 01/07/2023: 250 lbs 02/15/2022: 240 04/14/2022:220 Pt reports she is feeling great. She has lost another 20lbs. (46 lbs total). She is exercising and making dietary changes.  Prior note Patient reports she is feeling well.  She is tolerating increased dose of Wegovy of 1 mg weekly injections.  She will be on her fifth injection of this dose at the end of this week.  She is now down a total of 26 pounds.  She has increased her exercise and is hitting the benchmark of at least 150 minutes a week.  She is meal prepping.  She is still taking in high glycemic index carbohydrates. Prior note: Patient reports she is doing great with the [facility].  She is now lost about 16 pounds since the start of her journey.  She is exercising more, at about 120 minutes a week.  She reports she is starting to feel the difference since the weight loss.  She is no longer out of breath with activity.  She states it feels good to be able to climb the ladder at work and not feel out of breath when doing so.  She has altered her diet and motivated to stick to it.  Prior note Pt started on Wegovy since out last visit.  She is tolerating medication well.  She does admit she has a decreased appetite.  The first injection did cause her nausea but she has been tolerating since.  She has started exercising 3 times a week.  She has made cutbacks on her diet following a low glycemic index.  Prior note: Patient reports she gained weight with the Nexplanon and has had difficulty taking off  weight.  She has been overweight for quite a few years and reports she actually has had quite a bit of weight loss slowly over the last couple years.  SHe works at [employer] and her days consistently active.  She obtains many steps in her day and is lifting heavy objects.  She does not actively exercise for her weight loss. She has been attempting to track calories and reports approximately 1500 cal a day.     11/23/2021    3:47 PM 10/28/2021   11:03 AM 07/15/2013    9:20 AM  Depression screen PHQ 2/9  Decreased Interest 0 0   Down, Depressed, Hopeless 1 1   PHQ - 2 Score 1 1   Altered sleeping  0   Tired, decreased energy  0   Change in appetite  1   Feeling bad or failure about yourself   2   Trouble concentrating  0   Moving slowly or fidgety/restless  0   Suicidal thoughts  0   PHQ-9 Score  4      Information is confidential and restricted. Go to Review Flowsheets to unlock data.      10/28/2021   11:03 AM  GAD 7 : Generalized Anxiety Score  Nervous, Anxious, on Edge 1  Control/stop worrying 1  Worry too  much - different things 1  Trouble relaxing 0  Restless 0  Easily annoyed or irritable 1  Afraid - awful might happen 0  Total GAD 7 Score 4             No data to display           Immunization History  Administered Date(s) Administered   HPV Quadrivalent 07/17/2012, 09/16/2012, 01/21/2013   Influenza-Unspecified 06/04/2021   Meningococcal Conjugate 06/07/2015   Moderna Sars-Covid-2 Vaccination 10/23/2019, 11/27/2019, 12/25/2019   Tdap 09/04/2012    No results found.  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   PTSD  (post-traumatic stress disorder) 07/18/2012   Allergies  Allergen Reactions   Hydrocodone-Acetaminophen Other (See Comments)    Anxiety    Oxycodone Other (See Comments)    anxiety   Past Surgical History:  Procedure Laterality Date   CERVICAL BIOPSY  05/23/2021   LGSIL- pap 04/2021   TONSILLECTOMY  2021   TONSILLECTOMY AND ADENOIDECTOMY     WISDOM TOOTH EXTRACTION     Family History  Problem Relation Age of Onset   Hyperlipidemia Mother    Miscarriages / Stillbirths Mother        miscarrige   Hypertension Mother    Osteoarthritis Father    Hypertension Father    Diabetes Father    Hyperlipidemia Father    Depression Father    Mental illness Father    Skin cancer Father    ADD / ADHD Half-Brother    PDD Half-Brother    Bipolar disorder Paternal Aunt    Anxiety disorder Paternal Aunt    Anxiety disorder Paternal Aunt    Depression Paternal Aunt    Arthritis Maternal Grandmother    Breast cancer Maternal Grandmother    Arthritis Maternal Grandfather    Prostate cancer Maternal Grandfather    Mental illness Paternal Grandmother    Hypertension Paternal Grandmother    Hyperlipidemia Paternal Grandmother    Asthma Paternal Grandmother    Arthritis Paternal Grandmother    Diabetes Paternal Grandmother    Depression Paternal Grandmother    Lung cancer Paternal Grandmother        smoker   Cancer Paternal Grandfather        ?   Lung cancer Paternal Grandfather        smoker   Hyperlipidemia Paternal Grandfather    Hearing loss Paternal Grandfather    Hypertension Paternal Grandfather    COPD Paternal Grandfather    Kidney disease Paternal Grandfather    Heart attack Paternal Grandfather    Social History   Social History Narrative   Marital status/children/pets: Single.    Education/employment: HS grad, employed as IT sales professional     -smoke alarm in the home:Yes     - wears seatbelt: Yes     - Feels safe in their relationships: Yes             Allergies  as of 04/14/2022       Reactions   Hydrocodone-acetaminophen Other (See Comments)   Anxiety    Oxycodone Other (See Comments)   anxiety        Medication List  Accurate as of April 14, 2022  1:28 PM. If you have any questions, ask your nurse or doctor.          benzonatate 200 MG capsule Commonly known as: TESSALON Take 1 capsule (200 mg total) by mouth 2 (two) times daily as needed for cough.   clotrimazole 1 % cream Commonly known as: LOTRIMIN Apply 1 application. topically 2 (two) times daily.   clotrimazole-betamethasone cream Commonly known as: LOTRISONE clotrimazole-betamethasone 1 %-0.05 % topical cream   Lo Loestrin Fe 1 MG-10 MCG / 10 MCG tablet Generic drug: Norethindrone-Ethinyl Estradiol-Fe Biphas Take 1 tablet by mouth daily.   naltrexone 50 MG tablet Commonly known as: DEPADE Take 0.5 tablets (25 mg total) by mouth 2 (two) times daily before lunch and supper.   ondansetron 4 MG tablet Commonly known as: ZOFRAN Take 1 tablet (4 mg total) by mouth every 8 (eight) hours as needed for nausea or vomiting.   Wegovy 1.7 MG/0.75ML Soaj Generic drug: Semaglutide-Weight Management Inject 1.7 mg into the skin once a week.        All past medical history, surgical history, allergies, family history, immunizations andmedications were updated in the EMR today and reviewed under the history and medication portions of their EMR.     No results found.   ROS 14 pt review of systems performed and negative (unless mentioned in an HPI)  Objective: BP 104/71   Pulse 91   Temp 98.2 F (36.8 C) (Oral)   Ht 5' 2.5" (1.588 m)   Wt 220 lb (99.8 kg)   LMP 03/23/2022   SpO2 99%   BMI 39.60 kg/m  Physical Exam Vitals and nursing note reviewed.  Constitutional:      General: She is not in acute distress.    Appearance: Normal appearance. She is obese. She is not ill-appearing or toxic-appearing.  Eyes:     Extraocular Movements: Extraocular movements  intact.     Conjunctiva/sclera: Conjunctivae normal.     Pupils: Pupils are equal, round, and reactive to light.  Cardiovascular:     Rate and Rhythm: Normal rate and regular rhythm.  Pulmonary:     Effort: Pulmonary effort is normal. No respiratory distress.     Breath sounds: Normal breath sounds. No wheezing, rhonchi or rales.  Musculoskeletal:     Right lower leg: No edema.     Left lower leg: No edema.  Neurological:     Mental Status: She is alert and oriented to person, place, and time. Mental status is at baseline.  Psychiatric:        Mood and Affect: Mood normal.        Behavior: Behavior normal.        Thought Content: Thought content normal.        Judgment: Judgment normal.    Assessment/plan: Angela Blackwell is a 26 y.o. female present for  Weight loss counseling, encounter for/obesity-BMI 39 She has lost 46 pounds! Patient has been counseled on exercise, calorie counting, weight loss and potential medications  -Patient has been provided with online resources for: Weekly net calorie calculator.  Applications for calorie counting.  Patient was advised to ensure she is taking in adequate nutrition daily by meeting calorie goals. -Patient has been  educated on dietary changes to not only lose weight but to eat healthy.  Patient was educated on glycemic index.  She was reminded again low glycemic index avoid Pasta!  Stick with lean meats, veggies and fruits low glycemic index she  is gaining confidence and sticking to her diet.  She is now meal prepping. -Patient was educated on exercise goal of 150 minutes a week (plus warm up and cool down) of cardiovascular exercise.  Patient was educated on heart rate for cardiovascular and fat burning zones.  Heart rate at least in the 150s sustained during this 150 minutes/week. -Patient was encouraged to maintain adequate water consumption of at least 100 ounces a day, more if exercising/sweating. - Continue Depade 25 mg twice daily  prescribed to help with cravings and snacking.  - TSH, lipids, CMP and A1c are normal -Wegovy increased to 1.7 mg weekly Follow-up 8 weeks   No orders of the defined types were placed in this encounter.  Referral Orders  No referral(s) requested today     Note is dictated utilizing voice recognition software. Although note has been proof read prior to signing, occasional typographical errors still can be missed. If any questions arise, please do not hesitate to call for verification.  Electronically signed by: Felix Pacini, DO [facility] [place]

## 2022-04-14 NOTE — Patient Instructions (Signed)
Great job!!!   Vitals - 1 value per visit     Respirations Weight (lb) Height  10/28/2021  266  5' 2.5" (1.588 m)   11/23/2021  260.6    01/06/2022  250  5' 2.5" (1.588 m)   02/15/2022  240  5' 2.5" (1.588 m)   04/14/2022  220  5' 2.5" (1.588 m)        BMI  10/28/2021 47.88 kg/m2   11/23/2021 46.9 kg/m2   01/06/2022 45 kg/m2   02/15/2022 43.2 kg/m2   04/14/2022 39.6 kg/m2

## 2022-05-07 ENCOUNTER — Other Ambulatory Visit: Payer: Self-pay | Admitting: Family Medicine

## 2022-06-02 ENCOUNTER — Ambulatory Visit (INDEPENDENT_AMBULATORY_CARE_PROVIDER_SITE_OTHER): Payer: No Typology Code available for payment source | Admitting: Family Medicine

## 2022-06-02 ENCOUNTER — Encounter: Payer: Self-pay | Admitting: Family Medicine

## 2022-06-02 VITALS — BP 115/73 | HR 94 | Temp 97.9°F | Ht 62.5 in | Wt 205.0 lb

## 2022-06-02 DIAGNOSIS — Z713 Dietary counseling and surveillance: Secondary | ICD-10-CM

## 2022-06-02 DIAGNOSIS — E669 Obesity, unspecified: Secondary | ICD-10-CM

## 2022-06-02 NOTE — Progress Notes (Signed)
Patient ID: Angela Blackwell, female  DOB: December 15, 1995, 26 y.o.   MRN: [id number] Patient Care Team    Relationship Specialty Notifications Start End  Ma Hillock, DO PCP - General Family Medicine  10/28/21   Linda Hedges, [place] Physician Obstetrics and Gynecology  10/28/21     Chief Complaint  Patient presents with   Weight Check    Subjective: Angela Blackwell is a 26 y.o.  female present for  Start weight: 266 lbs.  11/23/2021: 260.6 lbs 01/07/2023: 250 lbs 02/15/2022: 240 04/14/2022:220 06/02/2022: 205 Body mass index is 36.9 kg/m.  She is doing great! Her appetite has recently increased some since coming off the BCP. She has a month of bleeding, that stopped Monday. She has f/u with her gyn tomorrow and will have labs. She is worried about cravings now, since they are more intense.   Prior note: Pt reports she is feeling great. She has lost another 20lbs. (46 lbs total). She is exercising and making dietary changes.  Prior note Patient reports she is feeling well.  She is tolerating increased dose of Wegovy of 1 mg weekly injections.  She will be on her fifth injection of this dose at the end of this week.  She is now down a total of 26 pounds.  She has increased her exercise and is hitting the benchmark of at least 150 minutes a week.  She is meal prepping.  She is still taking in high glycemic index carbohydrates. Prior note: Patient reports she is doing great with the [facility].  She is now lost about 16 pounds since the start of her journey.  She is exercising more, at about 120 minutes a week.  She reports she is starting to feel the difference since the weight loss.  She is no longer out of breath with activity.  She states it feels good to be able to climb the ladder at work and not feel out of breath when doing so.  She has altered her diet and motivated to stick to it.  Prior note Pt started on Wegovy since out last visit.  She is tolerating medication well.  She does admit  she has a decreased appetite.  The first injection did cause her nausea but she has been tolerating since.  She has started exercising 3 times a week.  She has made cutbacks on her diet following a low glycemic index.  Prior note: Patient reports she gained weight with the Nexplanon and has had difficulty taking off weight.  She has been overweight for quite a few years and reports she actually has had quite a bit of weight loss slowly over the last couple years.  SHe works at [employer] and her days consistently active.  She obtains many steps in her day and is lifting heavy objects.  She does not actively exercise for her weight loss. She has been attempting to track calories and reports approximately 1500 cal a day.     11/23/2021    3:47 PM 10/28/2021   11:03 AM 07/15/2013    9:20 AM  Depression screen PHQ 2/9  Decreased Interest 0 0   Down, Depressed, Hopeless 1 1   PHQ - 2 Score 1 1   Altered sleeping  0   Tired, decreased energy  0   Change in appetite  1   Feeling bad or failure about yourself   2   Trouble concentrating  0   Moving slowly or fidgety/restless  0   Suicidal thoughts  0   PHQ-9 Score  4      Information is confidential and restricted. Go to Review Flowsheets to unlock data.      10/28/2021   11:03 AM  GAD 7 : Generalized Anxiety Score  Nervous, Anxious, on Edge 1  Control/stop worrying 1  Worry too much - different things 1  Trouble relaxing 0  Restless 0  Easily annoyed or irritable 1  Afraid - awful might happen 0  Total GAD 7 Score 4             No data to display           Immunization History  Administered Date(s) Administered   HPV Quadrivalent 07/17/2012, 09/16/2012, 01/21/2013   Influenza-Unspecified 06/04/2021   Meningococcal Conjugate 06/07/2015   Moderna Sars-Covid-2 Vaccination 10/23/2019, 11/27/2019, 12/25/2019   Tdap 09/04/2012    No results found.  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) 12/09/2014   PTSD (post-traumatic stress disorder) 07/18/2012   Allergies  Allergen Reactions   Hydrocodone-Acetaminophen Other (See Comments)    Anxiety    Oxycodone Other (See Comments)    anxiety   Past Surgical History:  Procedure Laterality Date   CERVICAL BIOPSY  05/23/2021   LGSIL- pap 04/2021   TONSILLECTOMY  2021   TONSILLECTOMY AND ADENOIDECTOMY     WISDOM TOOTH EXTRACTION     Family History  Problem Relation Age of Onset   Hyperlipidemia Mother    Miscarriages / Stillbirths Mother        miscarrige   Hypertension Mother    Osteoarthritis Father    Hypertension Father    Diabetes Father    Hyperlipidemia Father    Depression Father    Mental illness Father    Skin cancer Father    ADD / ADHD Half-Brother    PDD Half-Brother    Bipolar disorder Paternal Aunt    Anxiety disorder Paternal Aunt    Anxiety disorder Paternal Aunt    Depression Paternal Aunt    Arthritis Maternal Grandmother    Breast cancer Maternal Grandmother    Arthritis Maternal Grandfather    Prostate cancer Maternal Grandfather    Mental illness Paternal Grandmother    Hypertension Paternal Grandmother    Hyperlipidemia Paternal Grandmother    Asthma Paternal Grandmother    Arthritis Paternal Grandmother    Diabetes Paternal Grandmother    Depression Paternal Grandmother    Lung cancer Paternal Grandmother        smoker   Cancer Paternal Grandfather        ?   Lung cancer Paternal Grandfather        smoker   Hyperlipidemia Paternal Grandfather    Hearing loss Paternal Grandfather    Hypertension Paternal Grandfather    COPD Paternal Grandfather    Kidney disease Paternal Grandfather    Heart  attack Paternal Grandfather    Social History   Social History Narrative   Marital status/children/pets: Single.    Education/employment: HS grad, employed as Chemical engineer     -smoke alarm in the home:Yes     - wears seatbelt: Yes     - Feels  safe in their relationships: Yes             Allergies as of 06/02/2022       Reactions   Hydrocodone-acetaminophen Other (See Comments)   Anxiety    Oxycodone Other (See Comments)   anxiety        Medication List        Accurate as of June 02, 2022  2:32 PM. If you have any questions, ask your nurse or doctor.          STOP taking these medications    Lo Loestrin Fe 1 MG-10 MCG / 10 MCG tablet Generic drug: Norethindrone-Ethinyl Estradiol-Fe Biphas Stopped by: Howard Pouch, DO       TAKE these medications    albuterol 108 (90 Base) MCG/ACT inhaler Commonly known as: VENTOLIN HFA Inhale 2 puffs into the lungs every 6 (six) hours as needed for wheezing or shortness of breath.   clotrimazole 1 % cream Commonly known as: LOTRIMIN Apply 1 application. topically 2 (two) times daily.   clotrimazole-betamethasone cream Commonly known as: LOTRISONE clotrimazole-betamethasone 1 %-0.05 % topical cream   naltrexone 50 MG tablet Commonly known as: DEPADE Take 0.5 tablets (25 mg total) by mouth 2 (two) times daily before lunch and supper.   Wegovy 1.7 MG/0.75ML Soaj Generic drug: Semaglutide-Weight Management Inject 1.7 mg into the skin once a week.        All past medical history, surgical history, allergies, family history, immunizations andmedications were updated in the EMR today and reviewed under the history and medication portions of their EMR.     No results found.   ROS 14 pt review of systems performed and negative (unless mentioned in an HPI)  Objective: BP 115/73   Pulse 94   Temp 97.9 F (36.6 C) (Oral)   Ht 5' 2.5" (1.588 m)   Wt 205 lb (93 kg)   SpO2 99%   BMI 36.90 kg/m  Physical  Exam Vitals and nursing note reviewed.  Constitutional:      General: She is not in acute distress.    Appearance: Normal appearance. She is obese. She is not ill-appearing or toxic-appearing.  Eyes:     Extraocular Movements: Extraocular movements intact.     Conjunctiva/sclera: Conjunctivae normal.     Pupils: Pupils are equal, round, and reactive to light.  Cardiovascular:     Rate and Rhythm: Normal rate and regular rhythm.  Pulmonary:     Effort: Pulmonary effort is normal. No respiratory distress.     Breath sounds: Normal breath sounds. No wheezing, rhonchi or rales.  Musculoskeletal:     Right lower leg: No edema.     Left lower leg: No edema.  Neurological:     Mental Status: She is alert and oriented to person, place, and time. Mental status is at baseline.  Psychiatric:        Mood and Affect: Mood normal.        Behavior: Behavior normal.        Thought Content: Thought content normal.        Judgment: Judgment normal.    Assessment/plan: Angela Blackwell is a 26 y.o. female present for  Weight loss counseling, encounter for/obesity-BMI 36.9 She has lost 61 pounds! Patient has been counseled on exercise, calorie counting, weight loss and potential medications  -Patient has been provided with online resources for: Weekly net calorie calculator.  Applications for calorie counting.  Patient was advised to ensure she is taking in adequate  nutrition daily by meeting calorie goals. -Patient has been  educated on dietary changes to not only lose weight but to eat healthy.  Patient was educated on glycemic index.  She was reminded again low glycemic index avoid Pasta!  Stick with lean meats, veggies and fruits low glycemic index she is gaining confidence and sticking to her diet.  She is now meal prepping. -Patient was educated on exercise goal of 150 minutes a week (plus warm up and cool down) of cardiovascular exercise.  Patient was educated on heart rate for cardiovascular and fat  burning zones.  Heart rate at least in the 150s sustained during this 150 minutes/week. -Patient was encouraged to maintain adequate water consumption of at least 100 ounces a day, more if exercising/sweating. - Continue Depade 25 mg twice daily prescribed to help with cravings and snacking. We discussed types of snacks that are ok to kick a craving, in very small quantities.  - TSH, lipids, CMP and A1c are normal -continue Wegovy  1.7 mg weekly. Next visit will be close to 6 mos on this dose.  Follow-up 9 weeks   No orders of the defined types were placed in this encounter.  Referral Orders  No referral(s) requested today     Note is dictated utilizing voice recognition software. Although note has been proof read prior to signing, occasional typographical errors still can be missed. If any questions arise, please do not hesitate to call for verification.  Electronically signed by: Howard Pouch, DO [place]

## 2022-06-02 NOTE — Patient Instructions (Signed)
Vitals - 1 value per visit     Weight (lb) Height BMI  10/28/2021 266  5' 2.5" (1.588 m)  47.88 kg/m2   11/23/2021 260.6   46.9 kg/m2   01/06/2022 250  5' 2.5" (1.588 m)  45 kg/m2   02/15/2022 240  5' 2.5" (1.588 m)  43.2 kg/m2   04/14/2022 220  5' 2.5" (1.588 m)  39.6 kg/m2   06/02/2022 205  5' 2.5" (1.588 m)  36.9 kg/m2    AMAZING JOB!!!!!

## 2022-08-04 ENCOUNTER — Ambulatory Visit: Payer: No Typology Code available for payment source | Admitting: Family Medicine

## 2022-08-09 ENCOUNTER — Encounter: Payer: Self-pay | Admitting: Family Medicine

## 2022-08-09 ENCOUNTER — Ambulatory Visit (INDEPENDENT_AMBULATORY_CARE_PROVIDER_SITE_OTHER): Payer: No Typology Code available for payment source | Admitting: Family Medicine

## 2022-08-09 VITALS — BP 111/72 | HR 100 | Temp 98.1°F | Ht 62.5 in | Wt 194.0 lb

## 2022-08-09 DIAGNOSIS — E669 Obesity, unspecified: Secondary | ICD-10-CM | POA: Diagnosis not present

## 2022-08-09 DIAGNOSIS — Z713 Dietary counseling and surveillance: Secondary | ICD-10-CM

## 2022-08-09 MED ORDER — NALTREXONE HCL 50 MG PO TABS: 25.0000 mg | ORAL_TABLET | Freq: Two times a day (BID) | ORAL | 1 refills | Status: DC

## 2022-08-09 MED ORDER — WEGOVY 1.7 MG/0.75ML ~~LOC~~ SOAJ: 1.7000 mg | SUBCUTANEOUS | 2 refills | Status: DC

## 2022-08-09 NOTE — Patient Instructions (Signed)
Return in 2 months (on 10/23/2022) for Routine chronic condition follow-up.        Great to see you today.  I have refilled the medication(s) we provide.   If labs were collected, we will inform you of lab results once received either by echart message or telephone call.   - echart message- for normal results that have been seen by the patient already.   - telephone call: abnormal results or if patient has not viewed results in their echart.

## 2022-08-09 NOTE — Progress Notes (Signed)
Patient ID: Angela Blackwell, female  DOB: [birth year], 26 y.o.   MRN: [id number] Patient Care Team    Relationship Specialty Notifications Start End  Natalia Leatherwood, DO PCP - General Family Medicine  10/28/21   Mitchel Honour, DO Consulting Physician Obstetrics and Gynecology  10/28/21     Chief Complaint  Patient presents with   Obesity    Cmc; pt is not fasting    Subjective: Angela Blackwell is a 26 y.o.  female present for  Start weight: 266 lbs.  11/23/2021: 260.6 lbs 01/07/2023: 250 lbs 02/15/2022: 240 04/14/2022:220 06/02/2022: 205  08/09/2022: 194 Body mass index is 34.92 kg/m.  She is lost an additional 9 pounds and doing well.  Is tolerating Wegovy 1.7 mg weekly injection.  She still is using Depade but only on occasions.  She has had some cravings, but overall she feels she is fighting them well now.  Prior note: Pt reports she is feeling great. She has lost another 20lbs. (46 lbs total). She is exercising and making dietary changes.  Prior note Patient reports she is feeling well.  She is tolerating increased dose of Wegovy of 1 mg weekly injections.  She will be on her fifth injection of this dose at the end of this week.  She is now down a total of 26 pounds.  She has increased her exercise and is hitting the benchmark of at least 150 minutes a week.  She is meal prepping.  She is still taking in high glycemic index carbohydrates. Prior note: Patient reports she is doing great with the [facility].  She is now lost about 16 pounds since the start of her journey.  She is exercising more, at about 120 minutes a week.  She reports she is starting to feel the difference since the weight loss.  She is no longer out of breath with activity.  She states it feels good to be able to climb the ladder at work and not feel out of breath when doing so.  She has altered her diet and motivated to stick to it.  Prior note Pt started on Wegovy since out last visit.  She is tolerating medication well.   She does admit she has a decreased appetite.  The first injection did cause her nausea but she has been tolerating since.  She has started exercising 3 times a week.  She has made cutbacks on her diet following a low glycemic index.  Prior note: Patient reports she gained weight with the Nexplanon and has had difficulty taking off weight.  She has been overweight for quite a few years and reports she actually has had quite a bit of weight loss slowly over the last couple years.  SHe works at [employer] and her days consistently active.  She obtains many steps in her day and is lifting heavy objects.  She does not actively exercise for her weight loss. She has been attempting to track calories and reports approximately 1500 cal a day.     08/09/2022    1:57 PM 11/23/2021    3:47 PM 10/28/2021   11:03 AM 07/15/2013    9:20 AM  Depression screen PHQ 2/9  Decreased Interest 0 0 0   Down, Depressed, Hopeless 0 1 1   PHQ - 2 Score 0 1 1   Altered sleeping   0   Tired, decreased energy   0   Change in appetite   1   Feeling bad or  failure about yourself    2   Trouble concentrating   0   Moving slowly or fidgety/restless   0   Suicidal thoughts   0   PHQ-9 Score   4      Information is confidential and restricted. Go to Review Flowsheets to unlock data.      10/28/2021   11:03 AM  GAD 7 : Generalized Anxiety Score  Nervous, Anxious, on Edge 1  Control/stop worrying 1  Worry too much - different things 1  Trouble relaxing 0  Restless 0  Easily annoyed or irritable 1  Afraid - awful might happen 0  Total GAD 7 Score 4           No data to display           Immunization History  Administered Date(s) Administered   HPV Quadrivalent 07/17/2012, 09/16/2012, 01/21/2013   Influenza-Unspecified 06/04/2021   Meningococcal Conjugate 06/07/2015   Moderna Sars-Covid-2 Vaccination 10/23/2019, 11/27/2019, 12/25/2019   Tdap 09/04/2012    No results found.  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   PTSD (post-traumatic stress disorder) 07/18/2012   Allergies  Allergen Reactions   Hydrocodone-Acetaminophen Other (See Comments)    Anxiety    Oxycodone Other (See Comments)    anxiety   Past Surgical History:  Procedure Laterality Date   CERVICAL BIOPSY  05/23/2021   LGSIL- pap 04/2021   TONSILLECTOMY  2021   TONSILLECTOMY AND ADENOIDECTOMY     WISDOM TOOTH EXTRACTION     Family History  Problem Relation Age of Onset   Hyperlipidemia Mother    Miscarriages / Stillbirths Mother        miscarrige   Hypertension Mother    Osteoarthritis Father    Hypertension Father    Diabetes Father    Hyperlipidemia Father    Depression Father    Mental illness Father    Skin cancer Father    ADD / ADHD Half-Brother    PDD Half-Brother    Bipolar disorder Paternal Aunt    Anxiety disorder Paternal Aunt    Anxiety disorder Paternal Aunt    Depression Paternal Aunt    Arthritis Maternal Grandmother    Breast cancer Maternal Grandmother    Arthritis Maternal Grandfather    Prostate cancer Maternal Grandfather    Mental illness Paternal Grandmother    Hypertension Paternal Grandmother    Hyperlipidemia Paternal Grandmother    Asthma Paternal Grandmother    Arthritis Paternal Grandmother    Diabetes Paternal Grandmother    Depression Paternal Grandmother    Lung cancer Paternal Grandmother        smoker   Cancer Paternal Grandfather        ?   Lung cancer Paternal Grandfather        smoker   Hyperlipidemia Paternal Grandfather    Hearing loss Paternal Grandfather    Hypertension Paternal Grandfather    COPD Paternal Grandfather     Kidney disease Paternal Grandfather    Heart attack Paternal Grandfather    Social History   Social History Narrative   Marital status/children/pets: Single.    Education/employment: HS grad, employed as IT sales professional     -  smoke alarm in the home:Yes     - wears seatbelt: Yes     - Feels safe in their relationships: Yes             Allergies as of 08/09/2022       Reactions   Hydrocodone-acetaminophen Other (See Comments)   Anxiety    Oxycodone Other (See Comments)   anxiety        Medication List        Accurate as of August 09, 2022  2:18 PM. If you have any questions, ask your nurse or doctor.          albuterol 108 (90 Base) MCG/ACT inhaler Commonly known as: VENTOLIN HFA Inhale 2 puffs into the lungs every 6 (six) hours as needed for wheezing or shortness of breath.   clotrimazole 1 % cream Commonly known as: LOTRIMIN Apply 1 application. topically 2 (two) times daily.   clotrimazole-betamethasone cream Commonly known as: LOTRISONE clotrimazole-betamethasone 1 %-0.05 % topical cream   naltrexone 50 MG tablet Commonly known as: DEPADE Take 0.5 tablets (25 mg total) by mouth 2 (two) times daily before lunch and supper.   Wegovy 1.7 MG/0.75ML Soaj Generic drug: Semaglutide-Weight Management Inject 1.7 mg into the skin once a week.        All past medical history, surgical history, allergies, family history, immunizations andmedications were updated in the EMR today and reviewed under the history and medication portions of their EMR.     No results found.   Review of Systems  Constitutional:  Negative for chills and fever.  Gastrointestinal:  Negative for abdominal pain, diarrhea, nausea and vomiting.   14 pt review of systems performed and negative (unless mentioned in an HPI)  Objective: BP 111/72   Pulse 100   Temp 98.1 F (36.7 C) (Oral)   Ht 5' 2.5" (1.588 m)   Wt 194 lb (88 kg)   SpO2 99%   BMI 34.92 kg/m  Physical  Exam Vitals and nursing note reviewed.  Constitutional:      General: She is not in acute distress.    Appearance: Normal appearance. She is obese. She is not ill-appearing or toxic-appearing.  HENT:     Head: Normocephalic and atraumatic.  Eyes:     Extraocular Movements: Extraocular movements intact.     Conjunctiva/sclera: Conjunctivae normal.     Pupils: Pupils are equal, round, and reactive to light.  Cardiovascular:     Rate and Rhythm: Normal rate and regular rhythm.  Neurological:     Mental Status: She is alert and oriented to person, place, and time. Mental status is at baseline.  Psychiatric:        Mood and Affect: Mood normal.        Behavior: Behavior normal.        Thought Content: Thought content normal.        Judgment: Judgment normal.    Assessment/plan: Angela Blackwell is a 26 y.o. female present for  Weight loss counseling, encounter for/obesity-BMI 36.9 She has lost 70 pounds! Patient has been counseled on exercise, calorie counting, weight loss and potential medications  -Patient has been provided with online resources for: Weekly net calorie calculator.  Applications for calorie counting.  Patient was advised to ensure she is taking in adequate nutrition daily by meeting calorie goals. -Patient has been  educated on dietary changes to not only lose weight but to eat healthy.  Patient was educated on glycemic index.  She was reminded again  low glycemic index avoid Pasta!  Stick with lean meats, veggies and fruits low glycemic index she is gaining confidence and sticking to her diet.  She is now meal prepping. -Patient was educated on exercise goal of 150 minutes a week (plus warm up and cool down) of cardiovascular exercise.  Patient was educated on heart rate for cardiovascular and fat burning zones.  Heart rate at least in the 150s sustained during this 150 minutes/week. -Patient was encouraged to maintain adequate water consumption of at least 100 ounces a day,  more if exercising/sweating. - Continue Depade 25 mg twice daily as needed  -continue Wegovy  1.7 mg weekly. Next visit will be close to 9 mos on this dose.  [facility] 160 pounds is her first goal weight Follow-up 11 weeks   No orders of the defined types were placed in this encounter.  Referral Orders  No referral(s) requested today     Note is dictated utilizing voice recognition software. Although note has been proof read prior to signing, occasional typographical errors still can be missed. If any questions arise, please do not hesitate to call for verification.  Electronically signed by: Felix Pacinienee Bich Mchaney, DO [facility]

## 2022-10-20 ENCOUNTER — Encounter: Payer: Self-pay | Admitting: Family Medicine

## 2022-10-20 ENCOUNTER — Ambulatory Visit: Payer: No Typology Code available for payment source | Admitting: Family Medicine

## 2022-10-20 VITALS — BP 122/80 | HR 89 | Temp 97.9°F | Wt 185.4 lb

## 2022-10-20 DIAGNOSIS — E669 Obesity, unspecified: Secondary | ICD-10-CM

## 2022-10-20 DIAGNOSIS — Z713 Dietary counseling and surveillance: Secondary | ICD-10-CM

## 2022-10-20 DIAGNOSIS — Z23 Encounter for immunization: Secondary | ICD-10-CM | POA: Diagnosis not present

## 2022-10-20 MED ORDER — WEGOVY 1.7 MG/0.75ML ~~LOC~~ SOAJ: 1.7000 mg | SUBCUTANEOUS | 2 refills | Status: DC

## 2022-10-20 NOTE — Addendum Note (Signed)
Addended by: Beatrix Fetters on: 10/20/2022 02:25 PM   Modules accepted: Orders

## 2022-10-20 NOTE — Progress Notes (Signed)
Patient ID: Loudell Hirani, female  DOB: [birth year], 27 y.o.   MRN: [id number] Patient Care Team    Relationship Specialty Notifications Start End  Ma Hillock, DO PCP - General Family Medicine  10/28/21   Linda Hedges, DO Consulting Physician Obstetrics and Gynecology  10/28/21     Chief Complaint  Patient presents with   Obesity    cmc    Subjective: Jamara Chasin is a 27 y.o.  female present for  Start weight: 266 lbs.  11/23/2021: 260.6 lbs 01/07/2023: 250 lbs 02/15/2022: 240 04/14/2022:220 06/02/2022: 205  08/09/2022: 194  10/20/2022: 185 She is lost an additional 9 lbs pounds and doing well.  Is tolerating Wegovy 1.7 mg weekly injection.  She feels great and is now no longer using any medications.   Prior note: Pt reports she is feeling great. She has lost another 20lbs. (46 lbs total). She is exercising and making dietary changes.  Prior note Patient reports she is feeling well.  She is tolerating increased dose of Wegovy of 1 mg weekly injections.  She will be on her fifth injection of this dose at the end of this week.  She is now down a total of 26 pounds.  She has increased her exercise and is hitting the benchmark of at least 150 minutes a week.  She is meal prepping.  She is still taking in high glycemic index carbohydrates. Prior note: Patient reports she is doing great with the [facility].  She is now lost about 16 pounds since the start of her journey.  She is exercising more, at about 120 minutes a week.  She reports she is starting to feel the difference since the weight loss.  She is no longer out of breath with activity.  She states it feels good to be able to climb the ladder at work and not feel out of breath when doing so.  She has altered her diet and motivated to stick to it.  Prior note Pt started on Wegovy since out last visit.  She is tolerating medication well.  She does admit she has a decreased appetite.  The first injection did cause her nausea but she has been  tolerating since.  She has started exercising 3 times a week.  She has made cutbacks on her diet following a low glycemic index.  Prior note: Patient reports she gained weight with the Nexplanon and has had difficulty taking off weight.  She has been overweight for quite a few years and reports she actually has had quite a bit of weight loss slowly over the last couple years.  SHe works at [employer] and her days consistently active.  She obtains many steps in her day and is lifting heavy objects.  She does not actively exercise for her weight loss. She has been attempting to track calories and reports approximately 1500 cal a day.     08/09/2022    1:57 PM 11/23/2021    3:47 PM 10/28/2021   11:03 AM 07/15/2013    9:20 AM  Depression screen PHQ 2/9  Decreased Interest 0 0 0   Down, Depressed, Hopeless 0 1 1   PHQ - 2 Score 0 1 1   Altered sleeping   0   Tired, decreased energy   0   Change in appetite   1   Feeling bad or failure about yourself    2   Trouble concentrating   0   Moving slowly or fidgety/restless  0   Suicidal thoughts   0   PHQ-9 Score   4      Information is confidential and restricted. Go to Review Flowsheets to unlock data.      10/28/2021   11:03 AM  GAD 7 : Generalized Anxiety Score  Nervous, Anxious, on Edge 1  Control/stop worrying 1  Worry too much - different things 1  Trouble relaxing 0  Restless 0  Easily annoyed or irritable 1  Afraid - awful might happen 0  Total GAD 7 Score 4           No data to display           Immunization History  Administered Date(s) Administered   HPV Quadrivalent 07/17/2012, 09/16/2012, 01/21/2013   Influenza-Unspecified 06/04/2021   Meningococcal Conjugate 06/07/2015   Moderna Sars-Covid-2 Vaccination 10/23/2019, 11/27/2019, 12/25/2019   Tdap 09/04/2012    No results found.  Past Medical History:  Diagnosis Date   Depression    Family history of glaucoma 04/10/2017   Hypermetropia of both eyes  04/10/2017   LGSIL on Pap smear of cervix 04/25/2021   [facility]   Low grade squamous intraepithelial lesion (LGSIL) on cervicovaginal cytologic smear 04/25/2021   MDD (major depressive disorder), recurrent, severe, with psychosis ([place]) 12/09/2014   PTSD (post-traumatic stress disorder) 07/18/2012   Allergies  Allergen Reactions   Hydrocodone-Acetaminophen Other (See Comments)    Anxiety    Oxycodone Other (See Comments)    anxiety   Past Surgical History:  Procedure Laterality Date   CERVICAL BIOPSY  05/23/2021   LGSIL- pap 04/2021   TONSILLECTOMY  2021   TONSILLECTOMY AND ADENOIDECTOMY     WISDOM TOOTH EXTRACTION     Family History  Problem Relation Age of Onset   Hyperlipidemia Mother    Miscarriages / Stillbirths Mother        miscarrige   Hypertension Mother    Osteoarthritis Father    Hypertension Father    Diabetes Father    Hyperlipidemia Father    Depression Father    Mental illness Father    Skin cancer Father    ADD / ADHD Half-Brother    PDD Half-Brother    Bipolar disorder Paternal Aunt    Anxiety disorder Paternal Aunt    Anxiety disorder Paternal Aunt    Depression Paternal Aunt    Arthritis Maternal Grandmother    Breast cancer Maternal Grandmother    Arthritis Maternal Grandfather    Prostate cancer Maternal Grandfather    Mental illness Paternal Grandmother    Hypertension Paternal Grandmother    Hyperlipidemia Paternal Grandmother    Asthma Paternal Grandmother    Arthritis Paternal Grandmother    Diabetes Paternal Grandmother    Depression Paternal Grandmother    Lung cancer Paternal Grandmother        smoker   Cancer Paternal Grandfather        ?   Lung cancer Paternal Grandfather        smoker   Hyperlipidemia Paternal Grandfather    Hearing loss Paternal Grandfather    Hypertension Paternal Grandfather    COPD Paternal Grandfather    Kidney disease Paternal Grandfather    Heart attack Paternal Grandfather    Social History   Social  History Narrative   Marital status/children/pets: Single.    Education/employment: HS grad, employed as Chemical engineer     -smoke alarm in the home:Yes     - wears seatbelt: Yes     - Feels  safe in their relationships: Yes             Allergies as of 10/20/2022       Reactions   Hydrocodone-acetaminophen Other (See Comments)   Anxiety    Oxycodone Other (See Comments)   anxiety        Medication List        Accurate as of October 20, 2022  2:09 PM. If you have any questions, ask your nurse or doctor.          STOP taking these medications    Wegovy 1.7 MG/0.75ML Soaj Generic drug: Semaglutide-Weight Management Stopped by: Howard Pouch, DO       TAKE these medications    albuterol 108 (90 Base) MCG/ACT inhaler Commonly known as: VENTOLIN HFA Inhale 2 puffs into the lungs every 6 (six) hours as needed for wheezing or shortness of breath.   clotrimazole 1 % cream Commonly known as: LOTRIMIN Apply 1 application. topically 2 (two) times daily.   clotrimazole-betamethasone cream Commonly known as: LOTRISONE clotrimazole-betamethasone 1 %-0.05 % topical cream   naltrexone 50 MG tablet Commonly known as: DEPADE Take 0.5 tablets (25 mg total) by mouth 2 (two) times daily before lunch and supper.        All past medical history, surgical history, allergies, family history, immunizations andmedications were updated in the EMR today and reviewed under the history and medication portions of their EMR.     No results found.   Review of Systems  Constitutional:  Negative for chills and fever.  Gastrointestinal:  Negative for abdominal pain, diarrhea, nausea and vomiting.   14 pt review of systems performed and negative (unless mentioned in an HPI) Objective: BP 122/80   Pulse 89   Temp 97.9 F (36.6 C)   Wt 185 lb 6.4 oz (84.1 kg)   SpO2 100%   BMI 33.37 kg/m  Physical Exam Vitals and nursing note reviewed.  Constitutional:      General: She is  not in acute distress.    Appearance: Normal appearance. She is obese. She is not ill-appearing or toxic-appearing.  HENT:     Head: Normocephalic and atraumatic.  Eyes:     Extraocular Movements: Extraocular movements intact.     Conjunctiva/sclera: Conjunctivae normal.     Pupils: Pupils are equal, round, and reactive to light.  Cardiovascular:     Rate and Rhythm: Normal rate and regular rhythm.  Neurological:     Mental Status: She is alert and oriented to person, place, and time. Mental status is at baseline.  Psychiatric:        Mood and Affect: Mood normal.        Behavior: Behavior normal.        Thought Content: Thought content normal.        Judgment: Judgment normal.    Assessment/plan: Hermonie Rasmusson is a 27 y.o. female present for  Weight loss counseling, encounter for/obesity-BMI 36.9 - she is gaining confidence and sticking to her diet.   continue meal prepping. -Patient was educated on exercise goal of 150 minutes a week (plus warm up and cool down) of cardiovascular exercise.   -Patient was encouraged to maintain adequate water consumption of at least 100 ounces a day, more if exercising/sweating. - stopped depade - wegovy no longer covered. Off for about a week. Feels good and she feels she made the lifestyle changes to continue losing weight.  160 pounds is her first goal weight Follow-up 11 weeks  Influenza  vaccine needed Provided today  Meds ordered this encounter  Medications   DISCONTD: WEGOVY 1.7 MG/0.75ML SOAJ    Sig: Inject 1.7 mg into the skin once a week.    Dispense:  3 mL    Refill:  2   Referral Orders  No referral(s) requested today     Note is dictated utilizing voice recognition software. Although note has been proof read prior to signing, occasional typographical errors still can be missed. If any questions arise, please do not hesitate to call for verification.  Electronically signed by: Howard Pouch, DO [place]

## 2022-10-20 NOTE — Patient Instructions (Signed)
No follow-ups on file.        Great to see you today.  I have refilled the medication(s) we provide.   If labs were collected, we will inform you of lab results once received either by echart message or telephone call.   - echart message- for normal results that have been seen by the patient already.   - telephone call: abnormal results or if patient has not viewed results in their echart.  

## 2022-10-26 ENCOUNTER — Encounter: Payer: Self-pay | Admitting: Family Medicine

## 2022-11-16 ENCOUNTER — Other Ambulatory Visit: Payer: Self-pay

## 2022-11-16 ENCOUNTER — Encounter (HOSPITAL_BASED_OUTPATIENT_CLINIC_OR_DEPARTMENT_OTHER): Payer: Self-pay

## 2022-11-16 ENCOUNTER — Emergency Department (HOSPITAL_BASED_OUTPATIENT_CLINIC_OR_DEPARTMENT_OTHER)
Admission: EM | Admit: 2022-11-16 | Discharge: 2022-11-16 | Disposition: A | Discharge status: Home / Self Care | Payer: No Typology Code available for payment source | Attending: Emergency Medicine | Admitting: Emergency Medicine

## 2022-11-16 ENCOUNTER — Emergency Department (HOSPITAL_BASED_OUTPATIENT_CLINIC_OR_DEPARTMENT_OTHER): Payer: No Typology Code available for payment source

## 2022-11-16 DIAGNOSIS — R1011 Right upper quadrant pain: Secondary | ICD-10-CM | POA: Diagnosis not present

## 2022-11-16 LAB — COMPREHENSIVE METABOLIC PANEL
ALT: 17 U/L (ref 0–44)
AST: 18 U/L (ref 15–41)
Albumin: 4.1 g/dL (ref 3.5–5.0)
Alkaline Phosphatase: 65 U/L (ref 38–126)
Anion gap: 10 (ref 5–15)
BUN: 14 mg/dL (ref 6–20)
CO2: 24 mmol/L (ref 22–32)
Calcium: 9.2 mg/dL (ref 8.9–10.3)
Chloride: 106 mmol/L (ref 98–111)
Creatinine, Ser: 0.83 mg/dL (ref 0.44–1.00)
GFR, Estimated: >60 mL/min (ref >60)
Glucose, Bld: 92 mg/dL (ref 70–99)
Potassium: 3.8 mmol/L (ref 3.5–5.1)
Sodium: 140 mmol/L (ref 135–145)
Total Bilirubin: 0.5 mg/dL (ref 0.3–1.2)
Total Protein: 7 g/dL (ref 6.5–8.1)

## 2022-11-16 LAB — URINALYSIS, ROUTINE W REFLEX MICROSCOPIC
Bilirubin Urine: NEGATIVE
Glucose, UA: NEGATIVE mg/dL
Hgb urine dipstick: NEGATIVE
Ketones, ur: 15 mg/dL — AB
Leukocytes,Ua: NEGATIVE
Nitrite: NEGATIVE
Protein, ur: NEGATIVE mg/dL
Specific Gravity, Urine: 1.02 (ref 1.005–1.030)
pH: 6.5 (ref 5.0–8.0)

## 2022-11-16 LAB — PREGNANCY, URINE: Preg Test, Ur: NEGATIVE

## 2022-11-16 LAB — CBC
HCT: 40.9 % (ref 36.0–46.0)
Hemoglobin: 14 g/dL (ref 12.0–15.0)
MCH: 31.2 pg (ref 26.0–34.0)
MCHC: 34.2 g/dL (ref 30.0–36.0)
MCV: 91.1 fL (ref 80.0–100.0)
Platelets: 384 10*3/uL (ref 150–400)
RBC: 4.49 MIL/uL (ref 3.87–5.11)
RDW: 11.8 % (ref 11.5–15.5)
WBC: 6.9 10*3/uL (ref 4.0–10.5)
nRBC: 0 % (ref 0.0–0.2)

## 2022-11-16 LAB — LIPASE, BLOOD: Lipase: 24 U/L (ref 11–51)

## 2022-11-16 NOTE — ED Triage Notes (Addendum)
Pt presents with right lower abdominal pain that began on Sunday. Resolved that day, then reports lifting heavy object yesterday and pain returned. Pain is intermittent. Pain worse with movement such as bending down. Denies N/V/D. Pt reports stopping wegovy last month

## 2022-11-16 NOTE — ED Provider Notes (Signed)
[facility] Provider Note   CSN: [id number] Arrival date & time: 11/16/22  [id number]     History  Chief Complaint  Patient presents with   Abdominal Pain    Angela Blackwell is a 27 y.o. female with speech sound disorder, learning disorder, obesity previously on Wegovy, who presents with abdominal pain.   Pt presents with right central abdominal pain that began on Sunday. Resolved that day, then has been intermittent but daily since then.  Worse with movement and lifting objects. Denies fevers chills, N/V/D. Pt reports stopping wegovy last month has been dealing with constipation as result of that but has had normal bowel movements and had a bowel movement last night, is currently passing gas.  Denies any urinary symptoms, vaginal symptoms.  Last period was 2/27.  Is G2P0 after 2 first trimester miscarriages.   Abdominal Pain      Home Medications Prior to Admission medications   Medication Sig Start Date End Date Taking? Authorizing Provider  albuterol (VENTOLIN HFA) 108 (90 Base) MCG/ACT inhaler Inhale 2 puffs into the lungs every 6 (six) hours as needed for wheezing or shortness of breath. 04/14/22   Kuneff, Renee A, DO  clotrimazole (LOTRIMIN) 1 % cream Apply 1 application. topically 2 (two) times daily. 01/06/22   Kuneff, Renee A, DO  clotrimazole-betamethasone (LOTRISONE) cream clotrimazole-betamethasone 1 %-0.05 % topical cream    [provider]      Allergies    Hydrocodone-acetaminophen and Oxycodone    Review of Systems   Review of Systems  Gastrointestinal:  Positive for abdominal pain.   Review of systems Negative for f/c.  A 10 point review of systems was performed and is negative unless otherwise reported in HPI.  Physical Exam Updated Vital Signs BP 113/79   Pulse 70   Temp 98.1 F (36.7 C) (Oral)   Resp 18   Ht 5\' 2"  (1.575 m)   Wt 83.5 kg   LMP 10/31/2022   SpO2 98%   BMI 33.65 kg/m  Physical  Exam General: Normal appearing female, lying in bed.  HEENT: Sclera anicteric, MMM, trachea midline.  Cardiology: RRR, no murmurs/rubs/gallops. BL radial and DP pulses equal bilaterally.  Resp: Normal respiratory rate and effort. CTAB, no wheezes, rhonchi, crackles.  Abd: RUQ TTP with +murphy's sign. No RLQ TTP. No lower abdominal tenderness. Soft, non-distended. No rebound tenderness or guarding.  GU: Deferred. MSK: No peripheral edema or signs of trauma. Extremities without deformity or TTP. No cyanosis or clubbing. Skin: warm, dry.  Back: No CVA tenderness Neuro: A&Ox4, CNs II-XII grossly intact. MAEs. Sensation grossly intact.  Psych: Normal mood and affect.   ED Results / Procedures / Treatments   Labs (all labs ordered are listed, but only abnormal results are displayed) Labs Reviewed  URINALYSIS, ROUTINE W REFLEX MICROSCOPIC - Abnormal; Notable for the following components:      Result Value   Ketones, ur 15 (*)    All other components within normal limits  LIPASE, BLOOD  COMPREHENSIVE METABOLIC PANEL  CBC  PREGNANCY, URINE    EKG None  Radiology US Abdomen Limited RUQ (LIVER/GB)  Result Date: 11/16/2022 CLINICAL DATA:  right upper quadrant abdominal pain EXAM: ULTRASOUND ABDOMEN LIMITED RIGHT UPPER QUADRANT COMPARISON:  None Available. FINDINGS: Gallbladder: No gallstones or wall thickening visualized. No sonographic Murphy sign noted by sonographer. Common bile duct: Diameter: Normal, 4 mm Liver: No focal lesion identified. Within normal limits in parenchymal echogenicity. Portal vein is patent  on color Doppler imaging with normal direction of blood flow towards the liver. Other: None. IMPRESSION: Normal right upper quadrant ultrasound. No explanation for abdominal pain. Electronically Signed   By: Abigail Miyamoto M.D.   On: 11/16/2022 11:37    Procedures Procedures    Medications Ordered in ED Medications - No data to display  ED Course/ Medical Decision Making/  A&P                          Medical Decision Making Amount and/or Complexity of Data Reviewed Labs: ordered. Decision-making details documented in ED Course. Radiology: ordered. Decision-making details documented in ED Course.    This patient presents to the ED for concern of abdominal pain, this involves an extensive number of treatment options, and is a complaint that carries with it a high risk of complications and morbidity.  I considered the following differential and admission for this acute, potentially life threatening condition.   MDM:    For DDX for abdominal pain includes but is not limited to:  Abdominal exam without peritoneal signs. No evidence of acute abdomen at this time. Given colicky RUQ pain consider acute hepatobiliary disease (including acute cholecystitis or cholangitis) and will get RUQ [place]. Also considered acute pancreatitis (neg lipase). Consider but lower concern for PUD (including gastric perforation), acute appendicitis, or diverticulitis.  Consider possible pain from constipation due to [facility] as it does take several months for these effects to wear off.  Patient's last period was 2/27, no vaginal symptoms to suggest PID, she has no pelvic tenderness palpation, lower concern for ovarian torsion given the location of her pain, pregnancy test is negative, no concern for ectopic pregnancy.  Patient denies analgesia at this time.   Clinical Course as of 12/02/22 1507  Thu Nov 16, 2022  1032 WBC: 6.9 No leukocytosis [HN]  1032 Hemoglobin: 14.0 [HN]  1032 Preg Test, Ur: NEGATIVE [HN]  1032 Urinalysis, Routine w reflex microscopic -Urine, Clean Catch(!) Mild ketonuria [HN]  1139 Comprehensive metabolic panel wnl [HN]  [id number] Lipase: 24 [HN]  1147 US Abdomen Limited RUQ (LIVER/GB) FINDINGS: Gallbladder:  No gallstones or wall thickening visualized. No sonographic Murphy sign noted by sonographer.  Common bile duct:  Diameter: Normal, 4 mm  Liver:  No  focal lesion identified. Within normal limits in parenchymal echogenicity. Portal vein is patent on color Doppler imaging with normal direction of blood flow towards the liver.  Other: None.  IMPRESSION: Normal right upper quadrant ultrasound. No explanation for abdominal pain.   [HN]  [id number] Patient feeling improved. Discussed with her her negative [place] and lab findings, which are very reassuring. Still no signs of acute abdomen on exam. Discussed with her watchful waiting vs CT abd pelvis and patient would like to be discharged to home w/ close follow up. Discussed discharge instructions/return precautions.  [HN]    Clinical Course User Index [HN] Audley Hose, MD    Labs: I Ordered, and personally interpreted labs.  The pertinent results include:  those listed above  Imaging Studies ordered: I ordered imaging studies including RUQ [place] I independently visualized and interpreted imaging. 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   I have reviewed the patients home medicines and have made adjustments as needed  Problem List / ED Course: Problem List Items Addressed This Visit   None Visit  Diagnoses     Right upper quadrant abdominal pain    -  Primary                   This note was created using dictation software, which may contain spelling or grammatical errors.    Audley Hose, MD 12/02/22 [phone]

## 2022-11-16 NOTE — ED Notes (Signed)
Last menstrual cycle was 27th of Feb.

## 2022-11-16 NOTE — ED Notes (Signed)
Discharge paperwork reviewed entirely with patient, including Rx's and follow up care. Pain was under control. Pt verbalized understanding as well as all parties involved. No questions or concerns voiced at the time of discharge. No acute distress noted.   Pt ambulated out to PVA without incident or assistance.  

## 2022-11-16 NOTE — Discharge Instructions (Signed)
Thank you for coming to [facility] Emergency Department. You were seen for abdominal pain. We did an exam, labs, and imaging, and these showed no acute findings.  Please follow up with your primary care provider within 1 week.   Do not hesitate to return to the ED or call 911 if you experience: -Worsening symptoms -Nausea vomiting so severe you cannot eat or drink -Lightheadedness, passing out -Fevers/chills -Anything else that concerns you

## 2023-04-13 ENCOUNTER — Encounter: Payer: Self-pay | Admitting: *Deleted

## 2023-04-23 ENCOUNTER — Emergency Department (HOSPITAL_COMMUNITY)
Admission: EM | Admit: 2023-04-23 | Discharge: 2023-04-23 | Disposition: A | Discharge status: Home / Self Care | Payer: No Typology Code available for payment source | Attending: Emergency Medicine | Admitting: Emergency Medicine

## 2023-04-23 ENCOUNTER — Emergency Department (HOSPITAL_COMMUNITY): Payer: No Typology Code available for payment source

## 2023-04-23 ENCOUNTER — Encounter (HOSPITAL_COMMUNITY): Payer: Self-pay

## 2023-04-23 ENCOUNTER — Other Ambulatory Visit: Payer: Self-pay

## 2023-04-23 DIAGNOSIS — O219 Vomiting of pregnancy, unspecified: Secondary | ICD-10-CM | POA: Insufficient documentation

## 2023-04-23 DIAGNOSIS — R42 Dizziness and giddiness: Secondary | ICD-10-CM

## 2023-04-23 DIAGNOSIS — Z3A13 13 weeks gestation of pregnancy: Secondary | ICD-10-CM | POA: Insufficient documentation

## 2023-04-23 DIAGNOSIS — O26811 Pregnancy related exhaustion and fatigue, first trimester: Secondary | ICD-10-CM | POA: Insufficient documentation

## 2023-04-23 DIAGNOSIS — O99111 Other diseases of the blood and blood-forming organs and certain disorders involving the immune mechanism complicating pregnancy, first trimester: Secondary | ICD-10-CM | POA: Insufficient documentation

## 2023-04-23 DIAGNOSIS — R112 Nausea with vomiting, unspecified: Secondary | ICD-10-CM

## 2023-04-23 DIAGNOSIS — D72829 Elevated white blood cell count, unspecified: Secondary | ICD-10-CM | POA: Diagnosis not present

## 2023-04-23 LAB — CBC
HCT: 42.6 % (ref 36.0–46.0)
Hemoglobin: 14.2 g/dL (ref 12.0–15.0)
MCH: 31.2 pg (ref 26.0–34.0)
MCHC: 33.3 g/dL (ref 30.0–36.0)
MCV: 93.6 fL (ref 80.0–100.0)
Platelets: 363 10*3/uL (ref 150–400)
RBC: 4.55 MIL/uL (ref 3.87–5.11)
RDW: 12.2 % (ref 11.5–15.5)
WBC: 13.8 10*3/uL — ABNORMAL HIGH (ref 4.0–10.5)
nRBC: 0 % (ref 0.0–0.2)

## 2023-04-23 LAB — URINALYSIS, ROUTINE W REFLEX MICROSCOPIC
Bilirubin Urine: NEGATIVE
Glucose, UA: NEGATIVE mg/dL
Hgb urine dipstick: NEGATIVE
Ketones, ur: NEGATIVE mg/dL
Leukocytes,Ua: NEGATIVE
Nitrite: NEGATIVE
Protein, ur: NEGATIVE mg/dL
Specific Gravity, Urine: 1.002 — ABNORMAL LOW (ref 1.005–1.030)
pH: 6 (ref 5.0–8.0)

## 2023-04-23 LAB — HEPATIC FUNCTION PANEL
ALT: 13 U/L (ref 0–44)
AST: 17 U/L (ref 15–41)
Albumin: 3.4 g/dL — ABNORMAL LOW (ref 3.5–5.0)
Alkaline Phosphatase: 62 U/L (ref 38–126)
Bilirubin, Direct: <0.1 mg/dL (ref 0.0–0.2)
Total Bilirubin: 0.2 mg/dL — ABNORMAL LOW (ref 0.3–1.2)
Total Protein: 7.4 g/dL (ref 6.5–8.1)

## 2023-04-23 LAB — BASIC METABOLIC PANEL
Anion gap: 13 (ref 5–15)
BUN: 9 mg/dL (ref 6–20)
CO2: 20 mmol/L — ABNORMAL LOW (ref 22–32)
Calcium: 9.1 mg/dL (ref 8.9–10.3)
Chloride: 104 mmol/L (ref 98–111)
Creatinine, Ser: 0.58 mg/dL (ref 0.44–1.00)
GFR, Estimated: >60 mL/min (ref >60)
Glucose, Bld: 111 mg/dL — ABNORMAL HIGH (ref 70–99)
Potassium: 3.6 mmol/L (ref 3.5–5.1)
Sodium: 137 mmol/L (ref 135–145)

## 2023-04-23 LAB — HCG, SERUM, QUALITATIVE: Preg, Serum: POSITIVE — AB

## 2023-04-23 LAB — HCG, QUANTITATIVE, PREGNANCY: hCG, Beta Chain, Quant, S: 33735 m[IU]/mL — ABNORMAL HIGH (ref <5)

## 2023-04-23 MED ORDER — SODIUM CHLORIDE 0.9 % IV BOLUS: 1000.0000 mL | Freq: Once | INTRAVENOUS | Status: DC

## 2023-04-23 NOTE — ED Provider Notes (Signed)
[place] [facility] AT [facility] Provider Note   CSN: [id number] Arrival date & time: 04/23/23  1649     History  Chief Complaint  Patient presents with   Dizziness    Angela Blackwell is a 27 y.o. female.  27 year old female G3, P0 presents at [redacted] weeks gestation with concern for feeling lightheaded with migraines, intermittent sweats and chills.  States that she works at a [employer], has been on light duty/not doing any heavy lifting however has been out working in the heat.  No documented fevers, no dysuria, changes in bowel habits, vaginal bleeding or abnormal discharge.  Reports morning sickness.  Has been taking Tylenol for her headaches.  Prior to pregnancy, had migraines monthly with her menstrual cycle which she managed with Midol and aspirin.  Has recently switched OB offices.  Reports prior OB office would tell her to drink soda when she is feeling lightheaded.  Reports feeling lightheaded now and requesting something to eat/drink.       Home Medications Prior to Admission medications   Medication Sig Start Date End Date Taking? Authorizing Provider  albuterol (VENTOLIN HFA) 108 (90 Base) MCG/ACT inhaler Inhale 2 puffs into the lungs every 6 (six) hours as needed for wheezing or shortness of breath. 04/14/22   Kuneff, Renee A, DO  clotrimazole (LOTRIMIN) 1 % cream Apply 1 application. topically 2 (two) times daily. 01/06/22   Kuneff, Renee A, DO  clotrimazole-betamethasone (LOTRISONE) cream clotrimazole-betamethasone 1 %-0.05 % topical cream    [provider]      Allergies    Hydrocodone-acetaminophen and Oxycodone    Review of Systems   Review of Systems Negative except as per HPI Physical Exam Updated Vital Signs BP 120/63   Pulse 82   Temp 98.2 F (36.8 C)   Resp 16   Ht 5\' 2"  (1.575 m)   Wt 93.4 kg   SpO2 100%   BMI 37.68 kg/m  Physical Exam Vitals and nursing note reviewed.  Constitutional:      General: She is  not in acute distress.    Appearance: She is well-developed. She is not diaphoretic.  HENT:     Head: Normocephalic and atraumatic.  Cardiovascular:     Rate and Rhythm: Normal rate and regular rhythm.     Heart sounds: Normal heart sounds.  Pulmonary:     Effort: Pulmonary effort is normal.     Breath sounds: Normal breath sounds.  Abdominal:     Palpations: Abdomen is soft.     Tenderness: There is no abdominal tenderness. There is no right CVA tenderness or left CVA tenderness.  Skin:    General: Skin is warm and dry.     Findings: No erythema or rash.  Neurological:     Mental Status: She is alert and oriented to person, place, and time.  Psychiatric:        Behavior: Behavior normal.     ED Results / Procedures / Treatments   Labs (all labs ordered are listed, but only abnormal results are displayed) Labs Reviewed  BASIC METABOLIC PANEL - Abnormal; Notable for the following components:      Result Value   CO2 20 (*)    Glucose, Bld 111 (*)    All other components within normal limits  CBC - Abnormal; Notable for the following components:   WBC 13.8 (*)    All other components within normal limits  URINALYSIS, ROUTINE W REFLEX MICROSCOPIC - Abnormal; Notable for  the following components:   Color, Urine STRAW (*)    Specific Gravity, Urine 1.002 (*)    All other components within normal limits  HCG, SERUM, QUALITATIVE - Abnormal; Notable for the following components:   Preg, Serum POSITIVE (*)    All other components within normal limits  HCG, QUANTITATIVE, PREGNANCY - Abnormal; Notable for the following components:   hCG, Beta Chain, Quant, S 33,735 (*)    All other components within normal limits  HEPATIC FUNCTION PANEL - Abnormal; Notable for the following components:   Albumin 3.4 (*)    Total Bilirubin 0.2 (*)    All other components within normal limits    EKG None  Radiology US OB Comp Less 14 Wks  Result Date: 04/23/2023 CLINICAL DATA:  Pelvic pain  EXAM: OBSTETRIC <14 WK ULTRASOUND TECHNIQUE: Transabdominal ultrasound was performed for evaluation of the gestation as well as the maternal uterus and adnexal regions. COMPARISON:  None Available. FINDINGS: Intrauterine gestational sac: Single Yolk sac:  Not Visualized. Embryo:  Visualized. Cardiac Activity: Visualized. Heart Rate: 150 bpm CRL:   74.1 mm   13 w 4 d                  [place] EDC: 10/25/2023 Subchorionic hemorrhage:  None visualized. Maternal uterus/adnexae: Bilateral ovaries are visualized and appear within normal limits. There is no pelvic free fluid. IMPRESSION: 1. Single live intrauterine gestation measuring 13 weeks 4 days. No acute abnormality. Electronically Signed   By: Darliss Cheney M.D.   On: 04/23/2023 22:52    Procedures Procedures    Medications Ordered in ED Medications  sodium chloride 0.9 % bolus 1,000 mL (1,000 mLs Intravenous Patient Refused/Not Given 04/23/23 2236)    ED Course/ Medical Decision Making/ A&P                                 Medical Decision Making Amount and/or Complexity of Data Reviewed Labs: ordered.   This patient presents to the ED for concern of morning sickness, feeling dizzy, headaches, this involves an extensive number of treatment options, and is a complaint that carries with it a high risk of complications and morbidity.  The differential diagnosis includes but not limited to UTI, metabolic derangement, electrolyte abnormality    Co morbidities that complicate the patient evaluation  Pregnant, has not established care for this pregnancy. History of migraines, depression, PTSD   Additional history obtained:  Additional history obtained from significant other at bedside who contributes to history as above External records from outside source obtained and reviewed including prior imaging on file- normal RUQ US obtained 11/16/22   Lab Tests:  I Ordered, and personally interpreted labs.  The pertinent results include:  quant elevated  at 33,735. CMP without significant findings, UA without evidence of infection or dehydration. CBC with non specific leukocytosis at 13.8   Imaging Studies ordered:  I ordered imaging studies including OB US  I independently visualized and interpreted imaging which showed fetus with + FHT I agree with the radiologist interpretation   Problem List / ED Course / Critical interventions / Medication management  27 year old female presents to the ER with months of vomiting, feeling dizzy, now with migraine similar to prior migraines she would have with her menstrual cycle. BP normal, labs reassuring. [place] with +FHT. Offered IV fluids, patient declined, would like to be dc. Work up otherwise complete, patient dc to follow  up with OB.  I have reviewed the patients home medicines and have made adjustments as needed   Social Determinants of Health:  Has OB, has not been seen to establish care   Test / Admission - Considered:  Stable for dc with plan to follow up with OB, follow up at [facility] for OB related care         Final Clinical Impression(s) / ED Diagnoses Final diagnoses:  Dizziness  [redacted] weeks gestation of pregnancy  Nausea and vomiting, unspecified vomiting type    Rx / DC Orders ED Discharge Orders     None         Jeannie Fend, PA-C 04/24/23 0107    Nira Conn, MD 04/24/23 [phone]

## 2023-04-23 NOTE — ED Triage Notes (Addendum)
Patient said she is [redacted] weeks pregnant. Feels dizzy and like she wants to faint. Has fevers and headaches. This is patients first baby and 3rd time being pregnant. Is seeking prenatal care but has not been in 1 month.

## 2023-04-23 NOTE — ED Provider Notes (Incomplete)
[place] EM[facility] DE[facility] AT [facility] Provider Note   CSN: [id number] Arrival date & time: 04/23/23  1649     History {Add pertinent medical, surgical, social history, OB history to HPI:1} Chief Complaint  Patient presents with  . Dizziness    Angela Blackwell is a 27 y.o. female.  27 year old female G3, P0 presents at [redacted] weeks gestation with concern for feeling lightheaded with migraines, intermittent sweats and chills.  States that she works at a [employer], has been on light duty/not doing any heavy lifting however has been out working in the heat.  No documented fevers, no dysuria, changes in bowel habits, vaginal bleeding or abnormal discharge.  Reports morning sickness.  Has been taking Tylenol for her headaches.  Prior to pregnancy, had migraines monthly with her menstrual cycle which she managed with Midol and aspirin.  Has recently switched OB offices.  Reports prior OB office would tell her to drink soda when she is feeling lightheaded.  Reports feeling lightheaded now and requesting something to eat/drink.       Home Medications Prior to Admission medications   Medication Sig Start Date End Date Taking? Authorizing Provider  albuterol (VENTOLIN HFA) 108 (90 Base) MCG/ACT inhaler Inhale 2 puffs into the lungs every 6 (six) hours as needed for wheezing or shortness of breath. 04/14/22   Kuneff, Renee A, DO  clotrimazole (LOTRIMIN) 1 % cream Apply 1 application. topically 2 (two) times daily. 01/06/22   Kuneff, Renee A, DO  clotrimazole-betamethasone (LOTRISONE) cream clotrimazole-betamethasone 1 %-0.05 % topical cream    [provider]      Allergies    Hydrocodone-acetaminophen and Oxycodone    Review of Systems   Review of Systems Negative except as per HPI Physical Exam Updated Vital Signs BP 120/63   Pulse 82   Temp 98.2 F (36.8 C)   Resp 16   Ht 5\' 2"  (1.575 m)   Wt 93.4 kg   SpO2 100%   BMI 37.68 kg/m  Physical  Exam Vitals and nursing note reviewed.  Constitutional:      General: She is not in acute distress.    Appearance: She is well-developed. She is not diaphoretic.  HENT:     Head: Normocephalic and atraumatic.  Cardiovascular:     Rate and Rhythm: Normal rate and regular rhythm.     Heart sounds: Normal heart sounds.  Pulmonary:     Effort: Pulmonary effort is normal.     Breath sounds: Normal breath sounds.  Abdominal:     Palpations: Abdomen is soft.     Tenderness: There is no abdominal tenderness. There is no right CVA tenderness or left CVA tenderness.  Skin:    General: Skin is warm and dry.     Findings: No erythema or rash.  Neurological:     Mental Status: She is alert and oriented to person, place, and time.  Psychiatric:        Behavior: Behavior normal.     ED Results / Procedures / Treatments   Labs (all labs ordered are listed, but only abnormal results are displayed) Labs Reviewed  BASIC METABOLIC PANEL - Abnormal; Notable for the following components:      Result Value   CO2 20 (*)    Glucose, Bld 111 (*)    All other components within normal limits  CBC - Abnormal; Notable for the following components:   WBC 13.8 (*)    All other components within normal limits  URINALYSIS, ROUTINE W REFLEX MICROSCOPIC - Abnormal; Notable for the following components:   Color, Urine STRAW (*)    Specific Gravity, Urine 1.002 (*)    All other components within normal limits  HCG, SERUM, QUALITATIVE - Abnormal; Notable for the following components:   Preg, Serum POSITIVE (*)    All other components within normal limits  RESP PANEL BY RT-PCR (RSV, FLU A&B, COVID)  [id number]  HCG, QUANTITATIVE, PREGNANCY  HEPATIC FUNCTION PANEL    EKG None  Radiology No results found.  Procedures Procedures  {Document cardiac monitor, telemetry assessment procedure when appropriate:1}  Medications Ordered in ED Medications  sodium chloride 0.9 % bolus 1,000 mL (has no  administration in time range)    ED Course/ Medical Decision Making/ A&P   {   Click here for ABCD2, HEART and other calculatorsREFRESH Note before signing :1}                              Medical Decision Making Amount and/or Complexity of Data Reviewed Labs: ordered.   This patient presents to the ED for concern of ***, this involves an extensive number of treatment options, and is a complaint that carries with it a high risk of complications and morbidity.  The differential diagnosis includes ***   Co morbidities that complicate the patient evaluation  ***   Additional history obtained:  Additional history obtained from *** External records from outside source obtained and reviewed including ***   Lab Tests:  I Ordered, and personally interpreted labs.  The pertinent results include:  ***   Imaging Studies ordered:  I ordered imaging studies including ***  I independently visualized and interpreted imaging which showed *** I agree with the radiologist interpretation   Cardiac Monitoring: / EKG:  The patient was maintained on a cardiac monitor.  I personally viewed and interpreted the cardiac monitored which showed an underlying rhythm of: ***   Consultations Obtained:  I requested consultation with the ***,  and discussed lab and imaging findings as well as pertinent plan - they recommend: ***   Problem List / ED Course / Critical interventions / Medication management  *** I ordered medication including ***  for ***  Reevaluation of the patient after these medicines showed that the patient {resolved/improved/worsened:23923::"improved"} I have reviewed the patients home medicines and have made adjustments as needed   Social Determinants of Health:  ***   Test / Admission - Considered:  ***   {Document critical care time when appropriate:1} {Document review of labs and clinical decision tools ie heart score, Chads2Vasc2 etc:1}  {Document your  independent review of radiology images, and any outside records:1} {Document your discussion with family members, caretakers, and with consultants:1} {Document social determinants of health affecting pt's care:1} {Document your decision making why or why not admission, treatments were needed:1} Final Clinical Impression(s) / ED Diagnoses Final diagnoses:  None    Rx / DC Orders ED Discharge Orders     None

## 2023-04-23 NOTE — Discharge Instructions (Signed)
Follow up with OB  You can try Unisom tablets (look for active ingredient Doxylamine) with vitamin B6 (10-25mg ) to help with morning sickness.  You can return to any ER for worsening or concerning symptoms. Consider the [facility] and Childrens Unit at [facility] for pregnancy related concerns.

## 2023-04-23 NOTE — ED Notes (Signed)
Lab to add on HCG quantitative and Hepatic function panel

## 2023-04-24 ENCOUNTER — Encounter: Payer: Self-pay | Admitting: *Deleted

## 2023-04-24 DIAGNOSIS — Z3490 Encounter for supervision of normal pregnancy, unspecified, unspecified trimester: Secondary | ICD-10-CM | POA: Insufficient documentation

## 2023-04-25 ENCOUNTER — Encounter: Payer: Self-pay | Admitting: Obstetrics and Gynecology

## 2023-04-25 ENCOUNTER — Ambulatory Visit (INDEPENDENT_AMBULATORY_CARE_PROVIDER_SITE_OTHER): Payer: No Typology Code available for payment source | Admitting: Obstetrics and Gynecology

## 2023-04-25 VITALS — BP 124/81 | HR 99 | Wt 212.0 lb

## 2023-04-25 DIAGNOSIS — Z3481 Encounter for supervision of other normal pregnancy, first trimester: Secondary | ICD-10-CM | POA: Diagnosis not present

## 2023-04-25 DIAGNOSIS — R55 Syncope and collapse: Secondary | ICD-10-CM | POA: Diagnosis not present

## 2023-04-25 DIAGNOSIS — Z3490 Encounter for supervision of normal pregnancy, unspecified, unspecified trimester: Secondary | ICD-10-CM

## 2023-04-25 DIAGNOSIS — Z6838 Body mass index (BMI) 38.0-38.9, adult: Secondary | ICD-10-CM

## 2023-04-25 DIAGNOSIS — Z3A13 13 weeks gestation of pregnancy: Secondary | ICD-10-CM | POA: Diagnosis not present

## 2023-04-25 MED ORDER — ASPIRIN 81 MG PO TBEC: 81.0000 mg | DELAYED_RELEASE_TABLET | Freq: Every day | ORAL | 12 refills | Status: DC

## 2023-04-25 NOTE — Progress Notes (Addendum)
INITIAL PRENATAL VISIT NOTE  Subjective:  Angela Blackwell is a 27 y.o. G3P0020 at [redacted]w[redacted]d by LMP being seen today for her initial prenatal visit. She has an obstetric history significant for 2 x early SAB. She has a medical history significant for depression.  Feeling very bad so far in pregnancy, feels nauseous, hot, has had four "fainting" episodes and calling out of work due to feeling bad.   Patient reports  as above .   . Vag. Bleeding: None.  Movement: Absent. Denies leaking of fluid.    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   PTSD (post-traumatic stress disorder) 07/18/2012    Past Surgical History:  Procedure Laterality Date   CERVICAL BIOPSY  05/23/2021   LGSIL- pap 04/2021   TONSILLECTOMY  2021   TONSILLECTOMY AND ADENOIDECTOMY     WISDOM TOOTH EXTRACTION      OB History  Gravida Para Term Preterm AB Living  3       2    SAB IAB Ectopic Multiple Live Births  2            # Outcome Date GA Lbr Len/2nd Weight Sex Type Anes PTL Lv  3 Current           2 SAB           1 SAB             Social History   Socioeconomic History   Marital status: Married    Spouse name: Not on file   Number of children: Not on file   Years of education: Not on file   Highest education level: Not on file  Occupational History   Not on file  Tobacco Use   Smoking status: Every Day    Current packs/day: 0.00    Types: Cigarettes    Last attempt to quit: 09/2018    Years since quitting: 4.6   Smokeless tobacco: Former  Building services engineer status: Every Day  Substance and Sexual Activity   Alcohol use: Not Currently   Drug use: Never   Sexual activity: Yes     Partners: Male  Other Topics Concern   Not on file  Social History Narrative   Marital status/children/pets: Single.    Education/employment: HS grad, employed as IT sales professional     -smoke alarm in the home:Yes     - wears seatbelt: Yes     - Feels safe in their relationships: Yes            Social Determinants of Health   Financial Resource Strain: Not on file  Food Insecurity: Not on file  Transportation Needs: Not on file  Physical Activity: Sufficiently Active (10/28/2021)   Exercise Vital Sign    Days of Exercise per Week: 5 days    Minutes of Exercise per Session: 30 min  Stress: Not on file  Social Connections: Not on file    Family History  Problem Relation Age of Onset   Hyperlipidemia Mother    Miscarriages / [place] Mother        miscarrige   Hypertension Mother    Osteoarthritis Father    Hypertension  Father    Diabetes Father    Hyperlipidemia Father    Depression Father    Mental illness Father    Skin cancer Father    ADD / ADHD Half-Brother    PDD Half-Brother    Bipolar disorder Paternal Aunt    Anxiety disorder Paternal Aunt    Anxiety disorder Paternal Aunt    Depression Paternal Aunt    Arthritis Maternal Grandmother    Breast cancer Maternal Grandmother    Arthritis Maternal Grandfather    Prostate cancer Maternal Grandfather    Mental illness Paternal Grandmother    Hypertension Paternal Grandmother    Hyperlipidemia Paternal Grandmother    Asthma Paternal Grandmother    Arthritis Paternal Grandmother    Diabetes Paternal Grandmother    Depression Paternal Grandmother    Lung cancer Paternal Grandmother        smoker   Cancer Paternal Grandfather        ?   Lung cancer Paternal Grandfather        smoker   Hyperlipidemia Paternal Grandfather    Hearing loss Paternal Grandfather    Hypertension Paternal Grandfather    COPD Paternal Grandfather    Kidney disease Paternal Grandfather    Heart attack Paternal Grandfather       Current Outpatient Medications:    aspirin EC 81 MG tablet, Take 1 tablet (81 mg total) by mouth daily. Swallow whole., Disp: 30 tablet, Rfl: 12   Prenatal MV & Min w/FA-DHA (PRENATAL GUMMIES PO), , Disp: , Rfl:   Allergies  Allergen Reactions   Hydrocodone-Acetaminophen Other (See Comments)    Anxiety    Oxycodone Other (See Comments)    anxiety    Review of Systems: Negative except for what is mentioned in HPI.  Objective:   Vitals:   04/25/23 1409  BP: 124/81  Pulse: 99  Weight: 212 lb (96.2 kg)    Fetal Status: Fetal Heart Rate (bpm): 147   Movement: Absent     Physical Exam: BP 124/81   Pulse 99   Wt 212 lb (96.2 kg)   LMP 01/18/2023   BMI 38.78 kg/m  CONSTITUTIONAL: Well-developed, well-nourished female in no acute distress.  NEUROLOGIC: Alert and oriented to person, place, and time. Normal reflexes, muscle tone coordination. No cranial nerve deficit noted. PSYCHIATRIC: Normal mood and affect. Normal behavior. Normal judgment and thought content. SKIN: Skin is warm and dry. No rash noted. Not diaphoretic. No erythema. No pallor. HENT:  Normocephalic, atraumatic, External right and left ear normal. Oropharynx is clear and moist EYES: Conjunctivae and EOM are normal. Pupils are equal, round, and reactive to light. No scleral icterus.  NECK: Normal range of motion, supple, no masses CARDIOVASCULAR: Normal heart rate noted, regular rhythm RESPIRATORY: Effort and breath sounds normal, no problems with respiration noted BREASTS: symmetric, non-tender, no masses palpable ABDOMEN: Soft, nontender, nondistended, gravid. GU: normal appearing external female genitalia, nulliparous normal appearing cervix, scant white discharge in vagina, no lesions noted Bimanual: 13 weeks sized uterus, no adnexal tenderness or palpable lesions noted MUSCULOSKELETAL: Normal range of motion. EXT:  No edema and no tenderness. 2+ distal pulses.   Assessment and Plan:  Pregnancy:  G3P0020 at [redacted]w[redacted]d by LMP  1. Encounter for supervision of normal pregnancy, antepartum, unspecified gravidity Reviewed [facility] [employer] structure, multiple providers, fellows, medical students, virtual visits, MyChart.  - Pregnancy, Initial Screen - [place] MFM OB DETAIL +14 WK; Future - Comprehensive metabolic panel - Genetic Screening - obtain pap  records from prior office  2. Syncope, unspecified syncope type 4 LOC episodes thus far in pregnancy - AMB Referral to Cardio Obstetrics - reviewed strategies for avoiding what is likely vasovagal episodes such as improving hydration, eating regular snacks, taking breaks as needed - given she has already had 4 episodes, will have see cardio  3. BMI 38.0-38.9,adult - TSH - Hemoglobin A1c - start baby aspirin  4. Encounter for supervision of other normal pregnancy, first trimester - [facility] Basic Panel  5. [redacted] weeks gestation of pregnancy   Preterm labor symptoms and general obstetric precautions including but not limited to vaginal bleeding, contractions, leaking of fluid and fetal movement were reviewed in detail with the patient.  Please refer to After Visit Summary for other counseling recommendations.   Return in about 4 weeks (around 05/23/2023) for high OB.  Conan Bowens 04/25/2023 3:00 PM

## 2023-04-25 NOTE — Progress Notes (Signed)
Last pap 2023 [facility]

## 2023-04-26 LAB — COMPREHENSIVE METABOLIC PANEL
ALT: 11 IU/L (ref 0–32)
AST: 11 IU/L (ref 0–40)
Albumin: 3.8 g/dL — ABNORMAL LOW (ref 4.0–5.0)
Alkaline Phosphatase: 67 IU/L (ref 44–121)
BUN/Creatinine Ratio: 22 (ref 9–23)
BUN: 11 mg/dL (ref 6–20)
Bilirubin Total: <0.2 mg/dL (ref 0.0–1.2)
CO2: 19 mmol/L — ABNORMAL LOW (ref 20–29)
Calcium: 8.6 mg/dL — ABNORMAL LOW (ref 8.7–10.2)
Chloride: 103 mmol/L (ref 96–106)
Creatinine, Ser: 0.49 mg/dL — ABNORMAL LOW (ref 0.57–1.00)
Globulin, Total: 2.6 g/dL (ref 1.5–4.5)
Glucose: 77 mg/dL (ref 70–99)
Potassium: 4.3 mmol/L (ref 3.5–5.2)
Sodium: 137 mmol/L (ref 134–144)
Total Protein: 6.4 g/dL (ref 6.0–8.5)
eGFR: 135 mL/min/{1.73_m2} (ref >59)

## 2023-04-26 LAB — HEMOGLOBIN A1C
Est. average glucose Bld gHb Est-mCnc: 97 mg/dL
Hgb A1c MFr Bld: 5 % (ref 4.8–5.6)

## 2023-04-27 ENCOUNTER — Encounter: Payer: Self-pay | Admitting: *Deleted

## 2023-04-29 LAB — PREGNANCY, INITIAL SCREEN
Antibody Screen: NEGATIVE
Basophils Absolute: 0 10*3/uL (ref 0.0–0.2)
Basos: 0 %
Bilirubin, UA: NEGATIVE
Chlamydia trachomatis, NAA: NEGATIVE
EOS (ABSOLUTE): 0.1 10*3/uL (ref 0.0–0.4)
Eos: 1 %
Glucose, UA: NEGATIVE
HCV Ab: NONREACTIVE
HIV Screen 4th Generation wRfx: NONREACTIVE
Hematocrit: 38 % (ref 34.0–46.6)
Hemoglobin: 13 g/dL (ref 11.1–15.9)
Hepatitis B Surface Ag: NEGATIVE
Immature Grans (Abs): 0.1 10*3/uL (ref 0.0–0.1)
Immature Granulocytes: 1 %
Ketones, UA: NEGATIVE
Leukocytes,UA: NEGATIVE
Lymphocytes Absolute: 2.6 10*3/uL (ref 0.7–3.1)
Lymphs: 19 %
MCH: 31.2 pg (ref 26.6–33.0)
MCHC: 34.2 g/dL (ref 31.5–35.7)
MCV: 91 fL (ref 79–97)
Monocytes Absolute: 0.7 10*3/uL (ref 0.1–0.9)
Monocytes: 5 %
Neisseria Gonorrhoeae by PCR: NEGATIVE
Neutrophils Absolute: 10 10*3/uL — ABNORMAL HIGH (ref 1.4–7.0)
Neutrophils: 74 %
Nitrite, UA: NEGATIVE
Platelets: 330 10*3/uL (ref 150–450)
Protein,UA: NEGATIVE
RBC, UA: NEGATIVE
RBC: 4.17 x10E6/uL (ref 3.77–5.28)
RDW: 12.1 % (ref 11.7–15.4)
RPR Ser Ql: NONREACTIVE
Rh Factor: POSITIVE
Rubella Antibodies, IGG: 1.47 {index} (ref >0.99)
Specific Gravity, UA: 1.017 (ref 1.005–1.030)
Urobilinogen, Ur: 0.2 mg/dL (ref 0.2–1.0)
WBC: 13.5 10*3/uL — ABNORMAL HIGH (ref 3.4–10.8)
pH, UA: 6 (ref 5.0–7.5)

## 2023-04-29 LAB — HCV INTERPRETATION

## 2023-04-29 LAB — MICROSCOPIC EXAMINATION
Bacteria, UA: NONE SEEN
Casts: NONE SEEN /LPF
Epithelial Cells (non renal): NONE SEEN /HPF (ref 0–10)
RBC, Urine: NONE SEEN /HPF (ref 0–2)
WBC, UA: NONE SEEN /HPF (ref 0–5)

## 2023-04-29 LAB — URINE CULTURE, OB REFLEX

## 2023-04-29 LAB — TSH: TSH: 1.87 u[IU]/mL (ref 0.450–4.500)

## 2023-05-18 ENCOUNTER — Ambulatory Visit (INDEPENDENT_AMBULATORY_CARE_PROVIDER_SITE_OTHER): Payer: No Typology Code available for payment source | Admitting: Cardiology

## 2023-05-18 ENCOUNTER — Encounter: Payer: Self-pay | Admitting: Cardiology

## 2023-05-18 ENCOUNTER — Other Ambulatory Visit: Payer: No Typology Code available for payment source

## 2023-05-18 VITALS — BP 117/72 | HR 89 | Ht 62.0 in | Wt 221.1 lb

## 2023-05-18 DIAGNOSIS — Z3A17 17 weeks gestation of pregnancy: Secondary | ICD-10-CM

## 2023-05-18 DIAGNOSIS — R55 Syncope and collapse: Secondary | ICD-10-CM | POA: Diagnosis not present

## 2023-05-18 DIAGNOSIS — O99210 Obesity complicating pregnancy, unspecified trimester: Secondary | ICD-10-CM

## 2023-05-18 DIAGNOSIS — R519 Other specified pregnancy related conditions, second trimester: Secondary | ICD-10-CM

## 2023-05-18 MED ORDER — CYCLOBENZAPRINE HCL 10 MG PO TABS: 10.0000 mg | ORAL_TABLET | Freq: Three times a day (TID) | ORAL | 1 refills | Status: DC | PRN

## 2023-05-18 NOTE — Patient Instructions (Signed)
Medication Instructions:  Your physician recommends that you continue on your current medications as directed. Please refer to the Current Medication list given to you today.  *If you need a refill on your cardiac medications before your next appointment, please call your pharmacy*   Lab Work: None   Testing/Procedures: Your physician has requested that you have an echocardiogram - OB. Echocardiography is a painless test that uses sound waves to create images of your heart. It provides your doctor with information about the size and shape of your heart and how well your heart's chambers and valves are working. This procedure takes approximately one hour. There are no restrictions for this procedure. Please do NOT wear cologne, perfume, aftershave, or lotions (deodorant is allowed). Please arrive 15 minutes prior to your appointment time.  ZIO XT- Long Term Monitor Instructions  Your physician has requested you wear a ZIO patch monitor for 14 days.  This is a single patch monitor. [employer] supplies one patch monitor per enrollment. Additional stickers are not available. Please do not apply patch if you will be having a Nuclear Stress Test,  Echocardiogram, Cardiac CT, MRI, or Chest Xray during the period you would be wearing the  monitor. The patch cannot be worn during these tests. You cannot remove and re-apply the  ZIO XT patch monitor.    Billing and Patient Assistance Program Information  We have supplied [employer] with any of your insurance information on file for billing purposes. [employer] offers a sliding scale Patient Assistance Program for patients that do not have  insurance, or whose insurance does not completely cover the cost of the ZIO monitor.  You must apply for the Patient Assistance Program to qualify for this discounted rate.  To apply, please call [employer] at [phone], select option 4, select option 2, ask to apply for  Patient Assistance Program. Meredeth Ide will ask  your household income, and how many people  are in your household. They will quote your out-of-pocket cost based on that information.  [employer] will also be able to set up a 30-month, interest-free payment plan if needed.  If your monitor falls off in less than 4 days, contact our Monitor department at [phone].  If your monitor becomes loose or falls off after 4 days call [employer] at [phone] for  suggestions on securing your monitor    Follow-Up: At [facility], you and your health needs are our priority.  As part of our continuing mission to provide you with exceptional heart care, we have created designated Provider Care Teams.  These Care Teams include your primary Cardiologist (physician) and Advanced Practice Providers (APPs -  Physician Assistants and Nurse Practitioners) who all work together to provide you with the care you need, when you need it.  Your next appointment:   12 week(s)  Provider:   Thomasene Ripple, DO

## 2023-05-18 NOTE — Progress Notes (Signed)
Cardio-Obstetrics Clinic  New Evaluation  Date:  05/18/2023   ID:  Angela Blackwell, DOB 27-Oct-1995, MRN [id number]  PCP:  Natalia Leatherwood, DO   [facility] Providers Cardiologist:  None  Electrophysiologist:  None       Referring MD: Conan Bowens, MD   Chief Complaint: I have passed out twice  History of Present Illness:    Angela Blackwell is a 27 y.o. female [G3P0020] who is being seen today for the evaluation of syncope at the request of Conan Bowens, MD.   With history of depression, PTSD, obesity here today with her mother.  She tells me that she has had multiple episodes of syncope.  She notes that prior to pregnancy she had had several episodes of syncope.  She described pain as episodes that are short-lived.  Abrupt.  No warning signs.  When this happened she was consciousness for about 10 minutes or seconds.  Get back to normal without any change in her mental state.  She has not lost any bladder or bowel control.  Recently these are happening frequently.  She is concerned about this.  She does have associated shortness of breath with this as well.  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   PTSD (post-traumatic stress disorder) 07/18/2012    Past Surgical History:  Procedure Laterality Date   CERVICAL BIOPSY  05/23/2021   LGSIL- pap 04/2021   TONSILLECTOMY  2021   TONSILLECTOMY AND ADENOIDECTOMY     WISDOM TOOTH EXTRACTION        OB History     Gravida  3   Para      Term      Preterm      AB  2   Living         SAB  2   IAB      Ectopic       Multiple      Live Births                  Current Medications: Current Meds  Medication Sig   aspirin EC 81 MG tablet Take 1 tablet (81 mg total) by mouth daily. Swallow whole.   Prenatal MV & Min w/FA-DHA (PRENATAL GUMMIES PO)      Allergies:   Hydrocodone-acetaminophen and Oxycodone   Social History   Socioeconomic History   Marital status: Married    Spouse name: Not on file   Number of children: Not on file   Years of education: Not on file   Highest education level: Not on file  Occupational History   Not on file  Tobacco Use   Smoking status: Every Day    Current packs/day: 0.00    Types: Cigarettes    Last attempt to quit: 09/2018    Years since quitting: 4.7   Smokeless tobacco: Former  Building services engineer status: Every Day  Substance and Sexual Activity   Alcohol use: Not Currently   Drug use: Never   Sexual activity: Yes    Partners: Male  Other Topics Concern   Not  on file  Social History Narrative   Marital status/children/pets: Single.    Education/employment: HS grad, employed as IT sales professional     -smoke alarm in the home:Yes     - wears seatbelt: Yes     - Feels safe in their relationships: Yes            Social Determinants of Health   Financial Resource Strain: Not on file  Food Insecurity: Not on file  Transportation Needs: Not on file  Physical Activity: Sufficiently Active (10/28/2021)   Exercise Vital Sign    Days of Exercise per Week: 5 days    Minutes of Exercise per Session: 30 min  Stress: Not on file  Social Connections: Not on file      Family History  Problem Relation Age of Onset   Hyperlipidemia Mother    Miscarriages / [place] Mother        miscarrige   Hypertension Mother    Osteoarthritis Father    Hypertension Father    Diabetes Father    Hyperlipidemia Father    Depression Father    Mental illness Father    Skin cancer Father    ADD / ADHD Half-Brother    PDD Half-Brother    Bipolar disorder  Paternal Aunt    Anxiety disorder Paternal Aunt    Anxiety disorder Paternal Aunt    Depression Paternal Aunt    Arthritis Maternal Grandmother    Breast cancer Maternal Grandmother    Arthritis Maternal Grandfather    Prostate cancer Maternal Grandfather    Mental illness Paternal Grandmother    Hypertension Paternal Grandmother    Hyperlipidemia Paternal Grandmother    Asthma Paternal Grandmother    Arthritis Paternal Grandmother    Diabetes Paternal Grandmother    Depression Paternal Grandmother    Lung cancer Paternal Grandmother        smoker   Cancer Paternal Grandfather        ?   Lung cancer Paternal Grandfather        smoker   Hyperlipidemia Paternal Grandfather    Hearing loss Paternal Grandfather    Hypertension Paternal Grandfather    COPD Paternal Grandfather    Kidney disease Paternal Grandfather    Heart attack Paternal Grandfather       ROS:   Please see the history of present illness.    Reports syncope episodes All other systems reviewed and are negative.   Labs/EKG Reviewed:    EKG:   EKG was not ordered today.    Recent Labs: 04/25/2023: ALT 11; BUN 11; Creatinine, Ser 0.49; Hemoglobin 13.0; Platelets 330; Potassium 4.3; Sodium 137; TSH 1.870   Recent Lipid Panel Lab Results  Component Value Date/Time   CHOL 165 10/28/2021 11:35 AM   TRIG 118.0 10/28/2021 11:35 AM   HDL 44.50 10/28/2021 11:35 AM   CHOLHDL 4 10/28/2021 11:35 AM   LDLCALC 97 10/28/2021 11:35 AM    Physical Exam:    VS:  BP 117/72 (BP Location: Left Arm, Patient Position: Sitting, Cuff Size: Normal)   Pulse 89   Ht 5\' 2"  (1.575 m)   Wt 221 lb 1.6 oz (100.3 kg)   LMP 01/18/2023   SpO2 100%   BMI 40.44 kg/m     Wt Readings from Last 3 Encounters:  05/18/23 221 lb 1.6 oz (100.3 kg)  04/25/23 212 lb (96.2 kg)  04/23/23 206 lb (93.4 kg)     GEN:  Well nourished, well developed in no acute distress  HEENT: Normal NECK: No JVD; No carotid bruits LYMPHATICS: No  lymphadenopathy CARDIAC: RRR, no murmurs, rubs, gallops RESPIRATORY:  Clear to auscultation without rales, wheezing or rhonchi  ABDOMEN: Soft, non-tender, non-distended MUSCULOSKELETAL:  No edema; No deformity  SKIN: Warm and dry NEUROLOGIC:  Alert and oriented x 3 PSYCHIATRIC:  Normal affect    Risk Assessment/Risk Calculators:     CARPREG II Risk Prediction Index Score:  1.  The patient's risk for a primary cardiac event is 5%.            ASSESSMENT & PLAN:    Syncope Obesity in pregnancy  I would like to rule out a cardiovascular etiology of this palpitation and syncope, therefore at this time I would like to placed a zio patch for 14  days. In additon with the syncope a transthoracic echocardiogram will be ordered to assess LV/RV function and any structural abnormalities. Once these testing have been performed amd reviewed further reccomendations will be made. For now, I do reccomend that the patient goes to the nearest ED if  symptoms recur.  Keep pregnancy with a weight gain between 11 to 20 pounds.  She is 17 weeks and 1 day pregnant.   Patient Instructions  Medication Instructions:  Your physician recommends that you continue on your current medications as directed. Please refer to the Current Medication list given to you today.  *If you need a refill on your cardiac medications before your next appointment, please call your pharmacy*   Lab Work: None   Testing/Procedures: Your physician has requested that you have an echocardiogram - OB. Echocardiography is a painless test that uses sound waves to create images of your heart. It provides your doctor with information about the size and shape of your heart and how well your heart's chambers and valves are working. This procedure takes approximately one hour. There are no restrictions for this procedure. Please do NOT wear cologne, perfume, aftershave, or lotions (deodorant is allowed). Please arrive 15 minutes  prior to your appointment time.  ZIO XT- Long Term Monitor Instructions  Your physician has requested you wear a ZIO patch monitor for 14 days.  This is a single patch monitor. [employer] supplies one patch monitor per enrollment. Additional stickers are not available. Please do not apply patch if you will be having a Nuclear Stress Test,  Echocardiogram, Cardiac CT, MRI, or Chest Xray during the period you would be wearing the  monitor. The patch cannot be worn during these tests. You cannot remove and re-apply the  ZIO XT patch monitor.    Billing and Patient Assistance Program Information  We have supplied [employer] with any of your insurance information on file for billing purposes. [employer] offers a sliding scale Patient Assistance Program for patients that do not have  insurance, or whose insurance does not completely cover the cost of the ZIO monitor.  You must apply for the Patient Assistance Program to qualify for this discounted rate.  To apply, please call [employer] at [phone], select option 4, select option 2, ask to apply for  Patient Assistance Program. Meredeth Ide will ask your household income, and how many people  are in your household. They will quote your out-of-pocket cost based on that information.  [employer] will also be able to set up a 4-month, interest-free payment plan if needed.  If your monitor falls off in less than 4 days, contact our Monitor department at [phone].  If your monitor becomes loose or falls off after 4 days  call [employer] at [phone] for  suggestions on securing your monitor    Follow-Up: At [facility], you and your health needs are our priority.  As part of our continuing mission to provide you with exceptional heart care, we have created designated Provider Care Teams.  These Care Teams include your primary Cardiologist (physician) and Advanced Practice Providers (APPs -  Physician Assistants and Nurse Practitioners) who all work  together to provide you with the care you need, when you need it.  Your next appointment:   12 week(s)  Provider:   Thomasene Ripple, DO     Dispo:  No follow-ups on file.   Medication Adjustments/Labs and Tests Ordered: Current medicines are reviewed at length with the patient today.  Concerns regarding medicines are outlined above.  Tests Ordered: Orders Placed This Encounter  Procedures   LONG TERM MONITOR (3-14 DAYS)   ECHOCARDIOGRAM COMPLETE   Medication Changes: No orders of the defined types were placed in this encounter.

## 2023-05-22 DIAGNOSIS — O99210 Obesity complicating pregnancy, unspecified trimester: Secondary | ICD-10-CM | POA: Insufficient documentation

## 2023-05-22 NOTE — Progress Notes (Unsigned)
   PRENATAL VISIT NOTE  Subjective:  Angela Blackwell is a 27 y.o. G3P0020 at [redacted]w[redacted]d being seen today for ongoing prenatal care.  She is currently monitored for the following issues for this low-risk pregnancy and has Specific learning disorder, with impairment in reading, moderate; Speech sound disorder; Obesity (BMI 30-39.9); Vitamin D deficiency; Weight loss counseling, encounter for; Supervision of normal pregnancy; BMI 38.0-38.9,adult; and Obesity in pregnancy on their problem list.  Patient reports {[id number]}.   .  .   . Denies leaking of fluid.   The following portions of the patient's history were reviewed and updated as appropriate: allergies, current medications, past family history, past medical history, past social history, past surgical history and problem list.   Objective:  There were no vitals filed for this visit.  Fetal Status:           General:  Alert, oriented and cooperative. Patient is in no acute distress.  Skin: Skin is warm and dry. No rash noted.   Cardiovascular: Normal heart rate noted  Respiratory: Normal respiratory effort, no problems with respiration noted  Abdomen: Soft, gravid, appropriate for gestational age.        Pelvic: Cervical exam deferred        Extremities: Normal range of motion.     Mental Status: Normal mood and affect. Normal behavior. Normal judgment and thought content.   Assessment and Plan:  Pregnancy: G3P0020 at [redacted]w[redacted]d 1. Encounter for supervision of normal pregnancy, antepartum, unspecified gravidity MSAFP Flu shot offered   2. Obesity in pregnancy Has [place] on 9/26  3. Pregnancy headache in second trimester Recommended Mag/B12 or B complex.  Flexeril for headaches If this doesn't work, recommend f/u with HA specialist.   4. Syncope, unspecified syncope type Follow with cards. Reviewed Dr. Mallory Shirk note.   5. Pregnancy with 17 completed weeks gestation   Preterm labor symptoms and general obstetric precautions including but not  limited to vaginal bleeding, contractions, leaking of fluid and fetal movement were reviewed in detail with the patient. Please refer to After Visit Summary for other counseling recommendations.   No follow-ups on file.  Future Appointments  Date Time Provider Department Center  05/23/2023  2:50 PM Milas Hock, MD [facility]  05/31/2023 12:15 PM [facility] NURSE [facility]  05/31/2023 12:30 PM [facility] US1 [facility]  05/31/2023  3:05 PM [facility] ECHO 5 [id number]  09/11/2023 10:40 AM Tobb, Lavona Mound, DO [facility] None    Milas Hock, MD

## 2023-05-23 ENCOUNTER — Ambulatory Visit (INDEPENDENT_AMBULATORY_CARE_PROVIDER_SITE_OTHER): Payer: No Typology Code available for payment source | Admitting: Obstetrics and Gynecology

## 2023-05-23 ENCOUNTER — Encounter: Payer: Self-pay | Admitting: Obstetrics and Gynecology

## 2023-05-23 VITALS — BP 125/80 | HR 112 | Wt 223.0 lb

## 2023-05-23 DIAGNOSIS — O99210 Obesity complicating pregnancy, unspecified trimester: Secondary | ICD-10-CM

## 2023-05-23 DIAGNOSIS — D580 Hereditary spherocytosis: Secondary | ICD-10-CM | POA: Insufficient documentation

## 2023-05-23 DIAGNOSIS — R519 Headache, unspecified: Secondary | ICD-10-CM

## 2023-05-23 DIAGNOSIS — R55 Syncope and collapse: Secondary | ICD-10-CM

## 2023-05-23 DIAGNOSIS — Z3490 Encounter for supervision of normal pregnancy, unspecified, unspecified trimester: Secondary | ICD-10-CM

## 2023-05-23 DIAGNOSIS — Z3A17 17 weeks gestation of pregnancy: Secondary | ICD-10-CM

## 2023-05-23 DIAGNOSIS — O26892 Other specified pregnancy related conditions, second trimester: Secondary | ICD-10-CM

## 2023-05-23 NOTE — Patient Instructions (Addendum)
[facility]     [employer]

## 2023-05-24 ENCOUNTER — Encounter: Payer: Self-pay | Admitting: *Deleted

## 2023-05-25 LAB — AFP, SERUM, OPEN SPINA BIFIDA
AFP MoM: 1.13
AFP Value: 38.1 ng/mL
Gest. Age on Collection Date: 18 weeks
Maternal Age At EDD: 25.2 yr
OSBR Risk 1 IN: 8106
Test Results:: NEGATIVE
Weight: 223 [lb_av]

## 2023-05-31 ENCOUNTER — Other Ambulatory Visit: Payer: Self-pay | Admitting: *Deleted

## 2023-05-31 ENCOUNTER — Ambulatory Visit: Payer: No Typology Code available for payment source | Attending: Obstetrics and Gynecology

## 2023-05-31 ENCOUNTER — Ambulatory Visit (HOSPITAL_BASED_OUTPATIENT_CLINIC_OR_DEPARTMENT_OTHER): Payer: No Typology Code available for payment source

## 2023-05-31 ENCOUNTER — Ambulatory Visit: Payer: No Typology Code available for payment source

## 2023-05-31 ENCOUNTER — Encounter: Payer: Self-pay | Admitting: *Deleted

## 2023-05-31 ENCOUNTER — Ambulatory Visit: Payer: No Typology Code available for payment source | Admitting: *Deleted

## 2023-05-31 VITALS — BP 128/79 | HR 102

## 2023-05-31 DIAGNOSIS — Z3A19 19 weeks gestation of pregnancy: Secondary | ICD-10-CM | POA: Insufficient documentation

## 2023-05-31 DIAGNOSIS — Z363 Encounter for antenatal screening for malformations: Secondary | ICD-10-CM | POA: Insufficient documentation

## 2023-05-31 DIAGNOSIS — O321XX0 Maternal care for breech presentation, not applicable or unspecified: Secondary | ICD-10-CM | POA: Insufficient documentation

## 2023-05-31 DIAGNOSIS — R55 Syncope and collapse: Secondary | ICD-10-CM | POA: Diagnosis not present

## 2023-05-31 DIAGNOSIS — Z3490 Encounter for supervision of normal pregnancy, unspecified, unspecified trimester: Secondary | ICD-10-CM | POA: Diagnosis not present

## 2023-05-31 DIAGNOSIS — O99212 Obesity complicating pregnancy, second trimester: Secondary | ICD-10-CM | POA: Diagnosis not present

## 2023-05-31 DIAGNOSIS — Z362 Encounter for other antenatal screening follow-up: Secondary | ICD-10-CM

## 2023-05-31 DIAGNOSIS — Z832 Family history of diseases of the blood and blood-forming organs and certain disorders involving the immune mechanism: Secondary | ICD-10-CM

## 2023-05-31 DIAGNOSIS — Z3492 Encounter for supervision of normal pregnancy, unspecified, second trimester: Secondary | ICD-10-CM

## 2023-05-31 DIAGNOSIS — Z3689 Encounter for other specified antenatal screening: Secondary | ICD-10-CM | POA: Insufficient documentation

## 2023-05-31 LAB — ECHOCARDIOGRAM COMPLETE
AR max vel: 2.24 cm2
AV Area VTI: 2.18 cm2
AV Area mean vel: 2.21 cm2
AV Mean grad: 5.3 mmHg
AV Peak grad: 10.1 mmHg
Ao pk vel: 1.59 m/s
Area-P 1/2: 3.91 cm2
S' Lateral: 3.1 cm

## 2023-06-01 ENCOUNTER — Ambulatory Visit: Payer: Self-pay

## 2023-06-01 NOTE — Progress Notes (Unsigned)
[facility] at [facility] [address], [address] Phone:  [phone]   Fax:  [phone]    Name: Angela Blackwell Indication: FOB with hereditary spherocytosis.  DOB: 1996-05-26 Age: 27 y.o.   EDD: 10/25/2023 LMP: 01/18/2023 Referring Provider:  Conan Bowens, MD   EGA: [redacted]w[redacted]d Genetic Counselor: Sheppard Plumber, MS  OB Hx: [id number] Date of Appointment: 05/31/2023  Accompanied by: Molly Maduro (FOB) Face to Face Time: 60 Minutes   Pregnancy History:   This is Angela Blackwell's third pregnancy. She has had two early losses. Reports she takes B12, magnesium, Excedrin, aspirin, and prenatal gummies. Reports experiencing low grade fevers, fainting, light headedness, and dizziness since being pregnant. She reports being followed by her OB for these symptoms and is currently wearing a heart monitor. Denies personal history of diabetes, high blood pressure, thyroid conditions, and seizures. Denies bleeding, infections, and fevers in this pregnancy. Reports vaping every other day. She is decreasing use. We discussed that the use of e-cigarettes during pregnancy is not recommended due to the limited data and the possible adverse effects on the fetus. E-cigarettes can include chemicals such as nicotine as well as contaminants. Angela Blackwell is encouraged to continue decreasing use. Denies using tobacco, alcohol, or street drugs in this pregnancy.   Family History: A three-generation pedigree was created and scanned into Epic under the Media tab.  Angela Blackwell reports several family members with ADHD, including herself, her mother, and her maternal half-brother. Her 74 yo maternal half-brother also has autism and was diagnosed around 25 yo and social anxiety. We discussed that ADHD is typically multifactorial and not usually caused by a single genetic cause. We also discussed that we are unable to directly test for autism in a pregnancy. Genetic testing for individuals with a clinical diagnosis  of autism yields an explanation in only about 20% of cases, and the remaining 80% of cases are left with unknown etiology. Having an affected family member may increase the chance that [name]'s children will have autism; however, without genetic testing performed on affected family members, it is difficult to assess risk to the pregnancy and other family members. We also discussed and offered screening for fragile X syndrome, which is one of the most common causes of inherited intellectual disability and autism in males. Fragile X syndrome affects females as well. [name] elected fragile X syndrome carrier screening.   Angela Blackwell reports that her maternal uncle has an 25 yo daughter with Down syndrome. She may have been diagnosed prenatally, and her mother was around 29 yo at delivery. We discussed that most cases of Down syndrome occur due to random chance and are isolated cases in a family. However, without genetic testing or medical reports a familial cause to Down syndrome or the possibility of another genetic condition affecting the family member cannot be ruled out. We discussed that NIPS (cfDNA) can be performed to screen for Down syndrome and other aneuploidies. Valine elected NIPS.  Angela Blackwell reports that her maternal grandmother's half-sister has a daughter born with spina bifida. We discussed that spina bifida and other neural tube defects are typically multifactorial. Having a family member with a NTD may increase the risk for this and future pregnancies; however, given the far degree of relationship between the fetus and the affeceted family member, the risk to the fetus is not expected to be higher than the general population risk. [name] was reassured that most ONTDs can be detected by the anatomy ultrasound, and her ultrasound  did not show any findings of ONTDs. Furthermore, her msAFP screening was also screen negative.  Angela Blackwell reproductive partner, has hereditary spherocytosis. He reports having a  complicated medical history during his first year of life. He had jaundice and hypoxemia and needed blood transfusions during that time. He then had a splenectomy and a cholecystectomy. He has not had any genetic testing. He also reports having an arrhythmia; no additional information is known. Arrhythmias may have a genetic component, and Molly Maduro may have a cardiac evaluation if needed. Their baby's pediatrician should also be made aware of Angela Blackwell's arrhythmia for an evaluation if needed.  Angela Blackwell's mother, maternal uncle, maternal grandmother, and maternal grandmother's sister are all reported to have hereditary spherocytosis. His maternal half-brother's son may have symptoms of hereditary spherocytosis. The inheritance in this family suggests an autosomal dominant pattern. Discussion about hereditary spherocytosis is included below.  Angela Blackwell's maternal half-sister was born with a "hole in her heart." It has since resolved. It is not known whether she received any treatment. We discussed that a heart defect can be isolated and have multifactorial inheritance. Heart defects can also be due to chromosomal or genetic differences. The risk for recurrence depends on the etiology of the heart defect and the exact type of defect involved. Without medical or genetic testing reports, recurrence risk is difficult to assess; however, it is likely not to be above the general population risk of ~1%. Second trimester targeted ultrasound can detect most significant cardiac defects.  Maternal ethnicity reported as White and paternal ethnicity reported as White. Denies Ashkenazi Jewish ancestry.  Family history not remarkable for consanguinity, individuals with birth defects, intellectual disability, multiple spontaneous abortions, still births, or unexplained neonatal death.     Genetic Counseling:   Paternal hereditary spherocytosis. Hereditary spherocytosis is a genetic condition and type of congenital hemolytic  anemia. Genetic mutations that cause abnormal membrane and cytoskeletal proteins cause red blood cells to form an abnormal spherical shape instead of the typical biconcave shape. The onset of the condition can be as early as the neonatal period and symptoms of the condition include jaundice, anemia, splenomegaly, and cholelithiasis.   There are currently five genes known to be associated with hereditary spherocytosis: ANK1, EPB42, SLC4A1, SPTA1, and SPTB. Hereditary spherocytosis can follow both autosomal dominant and autosomal recessive inheritance patterns. The inheritance pattern depends on the specific gene and pathogenic variant. Based on Angela Blackwell's family history, we discussed that it would be more likely that he carries the autosomal dominant form of hereditary spherocytosis. We discussed that if he does have the autosomal dominant form, then the fetus would have a 50% chance of also being affected. We discussed that genetic testing would provide [place] with additional information. Since the couple shared that they would like to know if their current pregnancy is affected, we discussed the option of prenatal diagnosis through amniocentesis. The benefits, risks, and limitations of amniocentesis were reviewed. We discussed that having a known genetic test result from Molly Maduro will be the most informative prior to performing amniocentesis, as this will allow [place] and the testing lab to know the familial genetic variant beforehand, so the lab can more accurately search for and identify the genetic variant in the fetal sample. Molly Maduro stated that he would like to have genetic testing performed for himself. I informed him that he may be also referred to specialists, including adult genetics, to also review these genetic test results when they return. [facility] can perform testing on the five genes associated with  hereditary spherocytosis mentioned above. We can coordinate testing for him when Keimora comes in for her blood draw  on 06/06/2023.    Testing/Screening Options:   Cell-Free DNA Screening (cfDNA). This screen analyzes cell-free DNA originating from the placenta that is found in the maternal blood circulation during pregnancy to provide information regarding the presence or absence of extra DNA for chromosomes 13, 18, and 21 as well as the sex chromosomes. This test has detection rates of 99% for trisomy 21, 94% for trisomy 18, and >99% for trisomy 46. This is not a diagnostic test and may have false positives or negatives. It cannot screen for all chromosomal differences, all genetic conditions, or neural tube defects. Therefore, a low-risk cfDNA result does not ensure an unaffected pregnancy. If the test result is high-risk, it is recommended to further discuss and offer prenatal diagnostic testing such as chorionic villus sampling (CVS) or amniocentesis. Asako elected Panorama cfDNA screening. She has an appointment for a lab visit scheduled on 06/06/2023 for a blood draw.  Carrier screening. Carrier screening is a genetic test that informs whether an individual is a carrier of certain autosomal recessive or X-linked conditions. Carriers typically do not exhibit symptoms but may be at an increased risk for having an affected child. If two individuals who are carriers for the same autosomal recessive condition have a child together, then the child would have a 1/4, or 25%, chance of being affected with the condition. Per the ACOG Committee Opinion 691, anyone considering a pregnancy or who is currently pregnant should be offered carrier screening for, at minimum, cystic fibrosis (CF), spinal muscular atrophy (SMA), and hemoglobinopathies. The mode of inheritance, clinical manifestations of these conditions, as well as details about testing were reviewed. Carrier screening panels may be limited to these three conditions or may be more expanded to include a wide range of autosomal recessive and X-linked genetic conditions.  There are over 500 genes that can be tested for on a carrier screening panel. Some conditions may be severe and actionable, whereas other conditions may not yet be well-understood or may not have treatment options available. In the event that one partner were found to be a carrier for one or more conditions, carrier screening would be available to the partner for those conditions. This will help inform current and future pregnancy risks for inheriting certain genetic conditions. A negative result on carrier screening reduces the likelihood of being a carrier, however, does not entirely rule out the possibility. A negative result does not eliminate the chance for the couple's child to be affected with a genetic condition, as carrier screening does not evaluate all possible genetic conditions or all possible pathogenic variants for the genes included on the panel. After careful consideration of the options, Whitlee elected carrier screening for CF, SMA, hemoglobinopathies, and fragile X syndrome. She has an appointment for a lab visit scheduled on 06/06/2023 for a blood draw.  Newborn Screening. The [place] Newborn Screening (NBS) program will screen all newborn babies for cystic fibrosis, spinal muscular atrophy, hemoglobinopathies, and numerous other conditions.   Previous Testing Completed:  Jaelyn previously completed a maternal serum AFP screen in this pregnancy. The result is screen negative. Please see report for details. A negative result reduces the risk that the current pregnancy has an open neural tube defect. Closed neural tube defects and some open defects may not be detected by this screen.   Patient Plan:  Proceed with: Routine prenatal care. Panorama and Horizon (CF, SMA,  hemoglobinopathies, and fragile X syndrome) will be drawn on 06/06/2023. FOB may have genetic testing for hereditary spherocytosis on 06/06/2023.  Informed consent was obtained. All questions were answered.   I spent 60  minutes in the care of the patient today, including face-to-face time reviewing and discussing the genetic test results and available next steps.   Thank you for sharing in the care of [facility] with [place].  Please do not hesitate to contact us at [phone] if you have any questions.  Sheppard Plumber, MS Genetic Counselor  Genetic counseling student involved in appointment: No.

## 2023-06-06 ENCOUNTER — Ambulatory Visit: Payer: No Typology Code available for payment source | Attending: Obstetrics and Gynecology

## 2023-06-06 DIAGNOSIS — Z3482 Encounter for supervision of other normal pregnancy, second trimester: Secondary | ICD-10-CM

## 2023-06-06 DIAGNOSIS — Z818 Family history of other mental and behavioral disorders: Secondary | ICD-10-CM

## 2023-06-06 DIAGNOSIS — Z31430 Encounter of female for testing for genetic disease carrier status for procreative management: Secondary | ICD-10-CM

## 2023-06-15 ENCOUNTER — Telehealth: Payer: Self-pay

## 2023-06-15 NOTE — Telephone Encounter (Signed)
I spoke with Angela Blackwell to share her Panorama cell-free DNA screening (cfDNA). The result is low risk. This screening significantly reduces but does not eliminate the chance that the current pregnancy has Down syndrome (trisomy 26), trisomy 20, trisomy 59, and 22q11.2 microdeletion syndrome. Please see report for details. Additionally, there are many genetic conditions that cannot be detected by cfDNA.   The report did not include fetal sex, which may have been an error in the order form. I contacted the lab to see if they can report it, as Zerenity opted for that information to be included.  Sheppard Plumber, MS Genetic Counselor [facility] [phone]

## 2023-06-18 ENCOUNTER — Telehealth: Payer: Self-pay

## 2023-06-18 NOTE — Telephone Encounter (Signed)
I spoke with the patient to discuss her recent genetic screening results.  Angela Blackwell had carrier screening for cystic fibrosis, spinal muscular atrophy, alpha thalassemia, beta-hemoglobinopathies, and fragile X syndrome. She was not found to be a carrier for those conditions. Please see report for details. A negative result on carrier screening reduces but does not eliminate the chance of being a carrier.   Angela Blackwell had cell-free DNA screening (cfDNA) in this pregnancy. The result is low risk, consistent with a female fetus. This screening significantly reduces but does not eliminate the chance that the current pregnancy has Down syndrome (trisomy 35), trisomy 1, trisomy 21, common sex chromosome conditions, and 22q11.2 microdeletion syndrome. Please see report for details. Additionally, there are many genetic conditions that cannot be detected by cfDNA.   Angela Plumber, MS Genetic Counselor [facility] [phone]

## 2023-06-18 NOTE — Telephone Encounter (Signed)
I left a voicemail asking patient to call back to discuss her recent genetic screening results.

## 2023-06-21 ENCOUNTER — Ambulatory Visit (HOSPITAL_COMMUNITY)
Admission: EM | Admit: 2023-06-21 | Discharge: 2023-06-21 | Disposition: A | Discharge status: Home / Self Care | Payer: No Typology Code available for payment source

## 2023-06-21 DIAGNOSIS — F32A Depression, unspecified: Secondary | ICD-10-CM | POA: Diagnosis not present

## 2023-06-21 DIAGNOSIS — O99340 Other mental disorders complicating pregnancy, unspecified trimester: Secondary | ICD-10-CM

## 2023-06-21 NOTE — Discharge Instructions (Addendum)
Based on the information that you have provided and the presenting issues outpatient services and resources for have been recommended.  It is imperative that you follow through with treatment recommendations within 5-7 days from the of discharge to mitigate further risk to your safety and mental well-being. A list of referrals has been provided below to get you started.  You are not limited to the list provided.  In case of an urgent crisis, you may contact the Mobile Crisis Unit with [facility] at [phone].    Expectant and New Parents PrintFeedbackShare & BookmarkShare & Bookmark, Engineer, drilling to show all options, press Tab go to next optionFont Size:+- We have provided a list of [facility] resources for new and expectant parents that will assist in having a healthy pregnancy and birth outcome.  Whether you are a new parent or expectant parent, these resources can provide you with the knowledge and skills you need to feel competent, confident, and well connected for a healthy     pregnancy and for your infant.   Financial Support for Prenatal Care The Adopt-A-Mom Program  Sponsored by [employer], this program provides financial support for prenatal care for birthing people who do not qualify for Medicaid and are under/uninsured.   Pregnancy & Labor Support [place] Community Doula Program  [place] sees community doulas as valued members of the birthing care team. An application process has been established for community doulas to provide their services on [employer] campuses. Visit their online form to learn about the measures [place] has in place to elevate the doula role beyond visitor status. [place] works closely with community volunteers/programs to assist with providing no-cost doula care to patients who meet certain at-risk criteria. For more information, please email [email or web address]  [place] Pregnancy  & Parenting  Classes  [place] offers classes in [place] that are designed to help everyone in your family prepare for and adjust to life with a newborn.  Prenatal Care Programs CenteringPregnancy   Sponsored by the [facility] Department of Health and [employer], Division of [employer], CenteringPregnancy is prenatal care that includes health assessments, education and support. Individuals with similar due dates meet for their prenatal visits with two co-facilitators about 10 times during their pregnancy in the same Centering room. Each session lasts for about two hours with no time in the waiting and is facilitated by a Corporate investment banker and a Nurse or Economist.   Prepared Childbirth Classes at the [facility] Department of Public Health  Sponsored by the [facility] Department of Health and [employer], the Division of Public Health Prepared Childbirth Classes in person to help moms, birthing people, and their partners prepare for a healthy delivery and beyond. Whether you're a first-time birthing person or you've been through labor before, childbirth classes are a great way to get ready to welcome your newborn baby into the world. In the four-week class, parents will learn about signs of labor, medications and interventions, new mom care, breastfeeding, infant care, and more. Classes will be held one night a week for four weeks from 6:00 - 8:00 p.m. Call [phone] to register for childbirth classes. Medicaid covers the cost of the classes for individuals and their partners. For individuals without Medicaid, the class is $45.00 for the series.  Home Visitation Support Programs Care Management for High Risk Pregnant People [employer])  Sponsored by the [facility] Department of Health and [employer], Division of [employer]  Health, a free care management service available to pregnant individuals enrolled in [place] and to a limited  number of low income, uninsured pregnant individuals. Pregnancy Care Managers are registered nurses or social workers who will work with birthing people and your prenatal care provider to ensure the best possible care during pregnancy and up to sixty days after delivery.  Nurse Family Partnership  Sponsored by [employer], Nurse-Family Partnership (NFP) is an evidence-based nurse home visitation program that provides nurse home visitation to low-income, first-time mothers. A nurse home visitor educates the birthing person about the growth and development of her baby and helps them become more self-sufficient.   Healthy Beginnings Program: [facility] [place] and [address]  The [place] and [employer] offer [employer], a Games developer for minority individuals and their children. The evidence-based program seeks to help birthing people have healthy pregnancies and healthy children and maintain a healthy lifestyle between pregnancies. Eligible individuals must be pregnant or no more than 60 days postpartum, and services continue up to 2 years after the baby's birth. For more information contact Janit Bern at [phone] or [email or web address]  A Healthy Start Triad Baby Love Plus Program   Sponsored by [employer], Triad Baby Love Plus (TBLP) is a free program designed to promote healthy pregnancies, positive birth outcomes and provide participants with the knowledge and resources needed to give babies their best start in life.  [place] [employer]   Sponsored by the [employer] of Health and [employer], Division of [employer], [place] Family Connects offers nurse home visits to all deliveries in [place]. Nurse home visits provide support, education, and early identification and referral of health & safety concerns.  Healthy Start  Sponsored  by [employer] of the [place], [employer] is a  home-based support program that works with pregnant individuals and new parents with stress factors that make parenting an even tougher job.  Home Visitors help parents to nurture their children's development and create a positive living environment for their families.    Lactation Support Programs  WIC  Offered through the [facility] Department of Health and [employer] Division of [employer], [employer] (Women, Infant and Children) provides supplemental foods, health care referrals, and nutrition education for low-income pregnant, breastfeeding, and non-breastfeeding postpartum individuals, and to infants and children up to age five who are found to be at nutritional risk.  [place] Milk Support Group  The mission of [employer] Support Group is to promote, encourage, and normalize nursing for [employer] families. Their goal is to break socio-cultural stereotypes by providing families of color with education and support throughout their nursing journey. They are accepting of all breastfeeding/chestfeeding/bodyfeeding families in the community.  [place]  [place] offers Breastfeeding Support Groups allow birthing people to gather with other new birthing people to share breastfeeding stories, challenges and triumphs, get help from a certified lactation consultant, and weigh your baby on an infant scale.  Breastfeed [place]  Breastfeed [place] is a hub for evidence-based information and resources for people who breastfeed, their partners, and other family members in order to support their chestfeeding journey.  Baby Caf  Pregnant and breastfeeding families are invited to drop in and connect with a Advertising copywriter and other breastfeeding families. During this time, the lactation consultant is available to assist with breastfeeding concerns and provide infant weight checks.  Baby Caf is open in [place] and [employer] with no appointment  necessary!  In [place] at [address] on Tuesdays - 10:30 am - 12:30 pm In [employer] at [address] on Wednesdays - 10:30 am - 12:30 pm For more information contact: Chryl Heck at [phone]     Health Navigators Children's Home [employer] of [place] Health Navigators   Community Navigation is a completely free and universal service provided to all prenatal [facility] residents.  A fully trained Navigator is paired with an OB office to help support and address the social needs of each patient. Navigators take the time to meet each individual prenatal patient's nonmedical needs and connect them to local resources in our community, saving practitioners time.  HealthySteps Specialists   Sponsored by [employer] Society of [place], HealthySteps is evidence-based, interdisciplinary pediatric primary care program that ensures babies and toddlers receive nurturing parenting and have healthy development. Specialists meet with parents of children birth to age 77 at children's well-child visits in pediatric offices in [place].  Care Management for At-Risk Children [facility])   Sponsored by the [facility] Department of Health and [employer], Division of Public Health, Care Management for At-Risk Children [facility]) provides free case management service for eligible children from birth up to five years of age. The program serves children born at risk for developmental delays, children who have difficulty learning to speak or doing other things that a typical toddler or preschooler can, children whose parents have a concern that "something isn't right".  Safe Sleep Information Safe Sleep [employer]   Safe Sleep  is a program of the [facility] for Maternal and Infant Health with the goal to strengthen the adoption of infant safe sleep practices that reduce the risk of Sudden Infant Death Syndrome (SIDS) and that prevent infant sleep-related deaths such as accidental infant asphyxiation and  suffocation across the state.  Support for Premature Infants Family Support Network   The [employer] provides support, education, and caring connections to [facility] families who have a child with special needs or who have experienced a NICU stay.  Services are confidential and offered at no cost to our families.  Support for Infant Loss [place] Support for Miscarriage and Infant Loss    [place]'s support for miscarriage and infant loss provides follow-up grief support for individuals, couples and extended family members through our Spiritual Care team.  [facility]  [facility]'s Grief Support program serves individuals and families who are coping with the loss of a loved one.  Programs Promoting Parent and Infant/Child Engagement  Parents as Teachers [place] [employer] as Teachers [place] is a [employer] of a Insurance risk surveyor that provides parenting education and support to families throughout pregnancy until their child turns five.  Their focus is on encouraging parent/child interactions, understanding child development's impact on child behavior and strengthening the family as a whole.  This information sharing and skill development happens through home visits, group connections, screenings and resources.  They partner with families to build supportive relationships that empower families to prepare their children to enter school ready to learn, ready for life.  Their mission is to provide the information, support, and encouragement parents need to help their children develop optimally during the crucial early years of life.    The Basics [place]  The Basics are five evidence-based parenting and caregiving practices that can be used in everyday life to benefit young children of all backgrounds and in all corners of [place]. While the  program is being coordinated locally by a dedicated group of nonprofits and community leaders  committed to early childhood development, the Basics is part of a broader, national network of communities striving to help all children thrive.  Infant Eye Health and Vision  MyVision  A free digital resource led by expert ophthalmologists and optometrists to provide trusted information on eye health and vision.  When infants are first born, their vision skills are not yet fully developed. Instead, babies go through various stages of eyesight development throughout the first two years of their lives. This guide informs parents about their child's vision development and covers topics like the stages of eyesight development, signs of potential vision issues, what parents can do to help with development, and more!

## 2023-06-21 NOTE — ED Notes (Signed)
Patient discharged home by provider

## 2023-06-21 NOTE — Progress Notes (Signed)
   06/21/23 1222  [facility] Triage Screening (Walk-ins at [facility] only)  What Is the Reason for Your Visit/Call Today? Pt presents to [facility] voluntarily accompanied by her parents. Pt states that since she has been pregnant, she has been unable to work due to having migraines, fainting and dizziness. Pt states that she hasn't been feeling like herself and her parents thought she should get checked. Pt states that she had depression, anxiety and adjustment disorder in the past. Pt states that she vaped a few times before coming into the facility. Pt denies SI, HI, AVH and alcohol/drug usage at this present time.  How Long Has This Been Causing You Problems? 1-6 months  Have You Recently Had Any Thoughts About Hurting Yourself? No  Are You Planning to Commit Suicide/Harm Yourself At This time? No  Have you Recently Had Thoughts About Hurting Someone Karolee Ohs? No  Are You Planning To Harm Someone At This Time? No  Are you currently experiencing any auditory, visual or other hallucinations? No  Have You Used Any Alcohol or Drugs in the Past 24 Hours? No  Do you have any current medical co-morbidities that require immediate attention? Yes  Please describe current medical co-morbidities that require immediate attention: pregnancy  Clinician description of patient physical appearance/behavior: casually dressed, cooperative  What Do You Feel Would Help You the Most Today? Social Support;Treatment for Depression or other mood problem  If access to [facility] was not available, would you have sought care in the Emergency Department? No  Determination of Need Routine (7 days)  Options For Referral Outpatient Therapy

## 2023-06-21 NOTE — ED Provider Notes (Signed)
Behavioral Health Urgent Care Medical Screening Exam  Patient Name: Angela Blackwell MRN: [id number] Date of Evaluation: 06/21/23 Chief Complaint:  worsening depression Diagnosis:  Final diagnoses:  Perinatal depression, antepartum    History of Present illness: Angela Blackwell is a 27 y.o. female patient with a past psychiatric history significant for anxiety and depression who presents to the [facility] voluntary accompanied by her parents with complaints of worsening depression.    Patient states ever since a couple weeks after she found out she was pregnant she's been feeling depressed. She states that she is 21 or [redacted] weeks pregnant. She expresses feelings of sadness, loneliness, decreased energy, and decreased motivation. She states that's she's been having a hard time adjusting to being pregnant and has been working less at work, at least 3 hours per week due to not being able to walk far, bend over or lift heavy items at work. She states that she works for [employer]. She identifies current stressors as her baby's father working all the time, not having a lot of income right now due to working less ours, having to live with her parents and her in-laws causing her significant stress. She reports a history of anxiety and depression that started when she was 27 years old. She states that in the past she was able to control her anxiety and depression with therapy. She is interested in pregnancy support groups.   On evaluation, patient is alert and oriented x 4. Her thought process is linear and goal oriented. Her speech is clear and coherent. Her mood is depressed and affect is congruent and tearful. She denies SI/HI/AVH. There is no objective evidence that the patient is currently responding to internal or external stimuli. Patient is calm and cooperative on exam and does not appear to be in acute distress.   Plan of care: This provider consulted with Phillip Heal for outpatient support groups  for pregnant women. Also, discussed outpatient individual therapy.    Flowsheet Row ED from 06/21/2023 in [facility] ED from 04/23/2023 in [facility] Emergency Department at [facility] ED from 11/16/2022 in [facility] Emergency Department at [facility]  C-SSRS RISK CATEGORY No Risk No Risk No Risk       Psychiatric Specialty Exam  Presentation  General Appearance:Appropriate for Environment  Eye Contact:Fair  Speech:Clear and Coherent  Speech Volume:Normal  Handedness:Right   Mood and Affect  Mood: Depressed  Affect: Congruent; Tearful   Thought Process  Thought Processes: Coherent  Descriptions of Associations:Intact  Orientation:Full (Time, Place and Person)  Thought Content:Logical    Hallucinations:None  Ideas of Reference:None  Suicidal Thoughts:No  Homicidal Thoughts:No   Sensorium  Memory: Immediate Fair; Remote Fair; Recent Fair  Judgment: Fair  Insight: Fair   Art therapist  Concentration: Fair  Attention Span: Fair  Recall: [employer] of Knowledge: Fair  Language: Fair   Psychomotor Activity  Psychomotor Activity: Normal   Assets  Assets: Manufacturing systems engineer; Desire for Improvement; Housing; Health and safety inspector; Intimacy; Transportation; Vocational/Educational; Talents/Skills   Sleep  Sleep: Fair  Number of hours:  6   Physical Exam: Physical Exam Cardiovascular:     Rate and Rhythm: Tachycardia present.  Pulmonary:     Effort: Pulmonary effort is normal.  Musculoskeletal:        General: Normal range of motion.  Neurological:     Mental Status: She is alert and oriented to person, place, and time.    Review of Systems  Constitutional: Negative.   HENT: Negative.    Eyes: Negative.   Respiratory: Negative.  Negative for shortness of breath.   Cardiovascular: Negative.  Negative for chest pain and palpitations.  Gastrointestinal:  Negative.   Genitourinary: Negative.   Musculoskeletal: Negative.   Neurological: Negative.   Endo/Heme/Allergies: Negative.   Psychiatric/Behavioral:  Positive for depression.    Blood pressure 119/78, pulse (!) 102, temperature 98.3 F (36.8 C), temperature source Oral, resp. rate 16, last menstrual period 01/18/2023, SpO2 100%. There is no height or weight on file to calculate BMI.  Musculoskeletal: Strength & Muscle Tone: within normal limits Gait & Station: normal Patient leans: N/A   BHUC MSE Discharge Disposition for Follow up and Recommendations: Based on my evaluation the patient does not appear to have an emergency medical condition and can be discharged with resources and follow up care in outpatient services for Individual Therapy and Group Therapy Based on the information that you have provided and the presenting issues outpatient services and resources for have been recommended.  It is imperative that you follow through with treatment recommendations within 5-7 days from the of discharge to mitigate further risk to your safety and mental well-being. A list of referrals has been provided below to get you started.  You are not limited to the list provided.  In case of an urgent crisis, you may contact the Mobile Crisis Unit with [facility] at [phone].    Expectant and New Parents PrintFeedbackShare & BookmarkShare & Bookmark, Engineer, drilling to show all options, press Tab go to next optionFont Size:+- We have provided a list of [facility] resources for new and expectant parents that will assist in having a healthy pregnancy and birth outcome.  Whether you are a new parent or expectant parent, these resources can provide you with the knowledge and skills you need to feel competent, confident, and well connected for a healthy     pregnancy and for your infant.   Financial Support for Prenatal Care The Adopt-A-Mom Program  Sponsored by [employer], this program provides financial support for prenatal care for birthing people who do not qualify for Medicaid and are under/uninsured.   Pregnancy & Labor Support [place] Community Doula Program  [place] sees community doulas as valued members of the birthing care team. An application process has been established for community doulas to provide their services on [employer] campuses. Visit their online form to learn about the measures [place] has in place to elevate the doula role beyond visitor status. [place] works closely with community volunteers/programs to assist with providing no-cost doula care to patients who meet certain at-risk criteria. For more information, please email [email or web address]  [place] Pregnancy  & Parenting Classes   offers classes in [place] that are designed to help everyone in your family prepare for and adjust to life with a newborn.  Prenatal Care Programs CenteringPregnancy   Sponsored by the [facility] Department of Health and [employer], Division of [employer], CenteringPregnancy is prenatal care that includes health assessments, education and support. Individuals with similar due dates meet for their prenatal visits with two co-facilitators about 10 times during their pregnancy in the same Centering room. Each session lasts for about two hours with no time in the waiting and is facilitated by a Corporate investment banker and a Nurse or Economist.   Prepared Childbirth Classes at the [facility] Department of [facility]  Sponsored by the [place]  [employer] of Health and [employer], the Division of Public Health Prepared Childbirth Classes in person to help moms, birthing people, and their partners prepare for a healthy delivery and beyond. Whether you're a first-time birthing person or you've been through labor before, childbirth classes are a great way to get  ready to welcome your newborn baby into the world. In the four-week class, parents will learn about signs of labor, medications and interventions, new mom care, breastfeeding, infant care, and more. Classes will be held one night a week for four weeks from 6:00 - 8:00 p.m. Call [phone] to register for childbirth classes. Medicaid covers the cost of the classes for individuals and their partners. For individuals without Medicaid, the class is $45.00 for the series.  Home Visitation Support Programs Care Management for High Risk Pregnant People [facility])  Sponsored by the [facility] Department of Health and [employer], Division of [employer], a free care management service available to pregnant individuals enrolled in [place] and to a limited number of low income, uninsured pregnant individuals. Pregnancy Care Managers are registered nurses or social workers who will work with birthing people and your prenatal care provider to ensure the best possible care during pregnancy and up to sixty days after delivery.  Nurse Family Partnership  Sponsored by [employer], Nurse-Family Partnership (NFP) is an evidence-based nurse home visitation program that provides nurse home visitation to low-income, first-time mothers. A nurse home visitor educates the birthing person about the growth and development of her baby and helps them become more self-sufficient.   Healthy Beginnings Program: [facility] and [address]  [address] Trail and [employer] offer [employer], a Games developer for minority individuals and their children. The evidence-based program seeks to help birthing people have healthy pregnancies and healthy children and maintain a healthy lifestyle between pregnancies. Eligible individuals must be pregnant or no more than 60 days postpartum, and services continue up to 2 years after the baby's birth. For more information contact Janit Bern at  [phone] or [email or web address]  A Healthy Start Triad Baby Love Plus Program   Sponsored by [employer], Triad Baby Love Plus (TBLP) is a free program designed to promote healthy pregnancies, positive birth outcomes and provide participants with the knowledge and resources needed to give babies their best start in life.  [place] [employer]   Sponsored by the [employer] of Health and [employer], Division of [employer], [place] Family Connects offers nurse home visits to all deliveries in [place]. Nurse home visits provide support, education, and early identification and referral of health & safety concerns.  Healthy Start  Sponsored  by [employer] of the [place], [employer] is a home-based support program that works with pregnant individuals and new parents with stress factors that make parenting an even tougher job.  Home Visitors help parents to nurture their children's development and create a positive living environment for their families.    Lactation Support Programs  WIC  Offered through the [facility] Department of Health and [employer], Division of [employer], [employer] (Women, Infant and Children) provides supplemental foods, health care referrals, and nutrition education for low-income pregnant, breastfeeding, and non-breastfeeding postpartum individuals, and to infants and children up to age five who are found to be at nutritional risk.  [place] Milk Support Group  The mission of [employer] Support Group is to promote, encourage, and normalize nursing for [employer] families.  Their goal is to break socio-cultural stereotypes by providing families of color with education and support throughout their nursing journey. They are accepting of all breastfeeding/chestfeeding/bodyfeeding families in the community.  [place]  [place] offers Breastfeeding Support Groups allow birthing  people to gather with other new birthing people to share breastfeeding stories, challenges and triumphs, get help from a certified lactation consultant, and weigh your baby on an infant scale.  Breastfeed [place]  Breastfeed  [place] is a hub for evidence-based information and resources for people who breastfeed, their partners, and other family members in order to support their chestfeeding journey.  Baby Caf  Pregnant and breastfeeding families are invited to drop in and connect with a Advertising copywriter and other breastfeeding families. During this time, the lactation consultant is available to assist with breastfeeding concerns and provide infant weight checks.  Baby Caf is open in [place] and [place] with no appointment necessary!  In [place] at [address] on Tuesdays - 10:30 am - 12:30 pm In [place] at [address] on Wednesdays - 10:30 am - 12:30 pm For more information contact: Chryl Heck at [phone]     Health Navigators Children's Home Society of [place] Health Navigators   Community Navigation is a completely free and universal service provided to all prenatal [place] [place] residents.  A fully trained Navigator is paired with an OB office to help support and address the social needs of each patient. Navigators take the time to meet each individual prenatal patient's nonmedical needs and connect them to local resources in our community, saving practitioners time.  HealthySteps Specialists   Sponsored by [employer] Society of [place], HealthySteps is evidence-based, interdisciplinary pediatric primary care program that ensures babies and toddlers receive nurturing parenting and have healthy development. Specialists meet with parents of children birth to age 62 at children's well-child visits in pediatric offices in [place].  Care Management for At-Risk Children [facility])   Sponsored by the [facility] Department of Health and [employer], Division of Public Health, Care Management for At-Risk Children [facility]) provides free case management service for eligible children from birth up to five years of age. The program serves children born at risk for developmental delays, children who have difficulty learning to speak or doing other things that a typical toddler or preschooler can, children whose parents have a concern that "something isn't right".  Safe Sleep Information Safe Sleep [employer]   Safe Sleep  is a program of the [facility] for Maternal and Infant Health with the goal to strengthen the adoption of infant safe sleep practices that reduce the risk of Sudden Infant Death Syndrome (SIDS) and that prevent infant sleep-related deaths such as accidental infant asphyxiation and suffocation across the state.  Support for Premature Infants Family Support Network   The [employer] provides support, education, and caring connections to [facility] families who have a child with special needs or who have experienced a NICU stay.  Services are confidential and offered at no cost to our families.  Support for Infant Loss [place] Support for Miscarriage and Infant Loss    [place]'s support for miscarriage and infant loss provides follow-up grief support for individuals, couples and extended family members through our Spiritual Care team.  Author[facility]Care  AuthoraCare's Grief Support program serves individuals and families who are coping with the loss of a loved one.  Programs Promoting Parent and Infant/Child Engagement  Parents as Teachers [employer]  Parents as Teachers [place] is a [employer] of a Insurance risk surveyor that provides parenting education and support to families throughout pregnancy until their child turns five.  Their focus is on encouraging parent/child interactions, understanding child development's impact on child behavior and strengthening the family as a  whole.  This information sharing and skill development happens through home visits, group connections, screenings and resources.  They partner with families to build supportive relationships that empower families to prepare their children to enter school ready to learn, ready for life.  Their mission is to provide the information, support, and encouragement parents need to help their children develop optimally during the crucial early years of life.    The Basics [place]  The Basics are five evidence-based parenting and caregiving practices that can be used in everyday life to benefit young children of all backgrounds and in all corners of [place]. While the program is being coordinated locally by a dedicated group of nonprofits and community leaders committed to early childhood development, the Basics is part of a broader, national network of communities striving to help all children thrive.  Infant Eye Health and Vision  MyVision  A free digital resource led by expert ophthalmologists and optometrists to provide trusted information on eye health and vision.  When infants are first born, their vision skills are not yet fully developed. Instead, babies go through various stages of eyesight development throughout the first two years of their lives. This guide informs parents about their child's vision development and covers topics like the stages of eyesight development, signs of potential vision issues, what parents can do to help with development, and more!    Kristoff Coonradt L, NP 06/21/2023, 2:17 PM

## 2023-06-25 ENCOUNTER — Ambulatory Visit (INDEPENDENT_AMBULATORY_CARE_PROVIDER_SITE_OTHER): Payer: No Typology Code available for payment source | Admitting: Obstetrics and Gynecology

## 2023-06-25 ENCOUNTER — Encounter: Payer: Self-pay | Admitting: Obstetrics and Gynecology

## 2023-06-25 ENCOUNTER — Telehealth: Payer: Self-pay | Admitting: *Deleted

## 2023-06-25 VITALS — BP 118/81 | HR 109 | Wt 241.0 lb

## 2023-06-25 DIAGNOSIS — Z3A22 22 weeks gestation of pregnancy: Secondary | ICD-10-CM

## 2023-06-25 DIAGNOSIS — F32A Depression, unspecified: Secondary | ICD-10-CM

## 2023-06-25 DIAGNOSIS — Z3490 Encounter for supervision of normal pregnancy, unspecified, unspecified trimester: Secondary | ICD-10-CM

## 2023-06-25 DIAGNOSIS — O99210 Obesity complicating pregnancy, unspecified trimester: Secondary | ICD-10-CM

## 2023-06-25 DIAGNOSIS — D580 Hereditary spherocytosis: Secondary | ICD-10-CM

## 2023-06-25 DIAGNOSIS — F419 Anxiety disorder, unspecified: Secondary | ICD-10-CM

## 2023-06-25 MED ORDER — SERTRALINE HCL 25 MG PO TABS: 25.0000 mg | ORAL_TABLET | Freq: Every day | ORAL | 0 refills | Status: DC

## 2023-06-25 MED ORDER — SERTRALINE HCL 50 MG PO TABS: 50.0000 mg | ORAL_TABLET | Freq: Every day | ORAL | 3 refills | Status: DC

## 2023-06-25 MED ORDER — BUSPIRONE HCL 5 MG PO TABS: 5.0000 mg | ORAL_TABLET | Freq: Two times a day (BID) | ORAL | 3 refills | Status: DC

## 2023-06-25 NOTE — Progress Notes (Addendum)
   PRENATAL VISIT NOTE  Subjective:  Angela Blackwell is a 27 y.o. G3P0020 at [redacted]w[redacted]d being seen today for ongoing prenatal care.  She is currently monitored for the following issues for this low-risk pregnancy and has Specific learning disorder, with impairment in reading, moderate; Speech sound disorder; Obesity (BMI 30-39.9); Vitamin D deficiency; Supervision of normal pregnancy; BMI 38.0-38.9,adult; Obesity in pregnancy; and FOB with hereditary spherocytosis on their problem list.  Patient reports  lightheaded . HA continues. Saw neurologist. Having increasing anxiety and panic attacks. Went to [facility] urgent care and told it was normal.  Contractions: Not present. Vag. Bleeding: None.  Movement: Present. Denies leaking of fluid.   The following portions of the patient's history were reviewed and updated as appropriate: allergies, current medications, past family history, past medical history, past social history, past surgical history and problem list.   Objective:   Vitals:   06/25/23 0948  BP: 118/81  Pulse: (!) 109  Weight: 241 lb (109.3 kg)    Fetal Status: Fetal Heart Rate (bpm): 147 Fundal Height: 22 cm Movement: Present     General:  Alert, oriented and cooperative. Patient is in no acute distress.  Skin: Skin is warm and dry. No rash noted.   Cardiovascular: Normal heart rate noted  Respiratory: Normal respiratory effort, no problems with respiration noted  Abdomen: Soft, gravid, appropriate for gestational age.  Pain/Pressure: Absent     Pelvic: Cervical exam deferred        Extremities: Normal range of motion.  Edema: None  Mental Status: Normal mood and affect. Normal behavior. Normal judgment and thought content.   Assessment and Plan:  Pregnancy: G3P0020 at [redacted]w[redacted]d 1. FOB with hereditary spherocytosis Message sent to [facility] to address this with pt. Other counseling done via phone on 9/27. FOB has been testing but awaiting results. They have heard no updates.   2. Obesity in  pregnancy Has serial growth with MFM  3. Encounter for supervision of normal pregnancy, antepartum, unspecified gravidity For lightheadedness continue follow up with Dr. Servando Salina. Echo wnl. Zio patch wnl. Otherwise suspect normal in pregnancy.  For mood - will start Zoloft and then Buspar. Has mixed anxiety/depression based on our conversation. Short term follow up to ensure improvement in 2 weeks.   Preterm labor symptoms and general obstetric precautions including but not limited to vaginal bleeding, contractions, leaking of fluid and fetal movement were reviewed in detail with the patient. Please refer to After Visit Summary for other counseling recommendations.   Return in about 4 weeks (around 07/23/2023) for OB VISIT, MD or APP, 2 hr GTT/28w labs.  Future Appointments  Date Time Provider Department Center  07/12/2023  1:50 PM Milas Hock, MD [facility]  07/24/2023  8:10 AM Rasch, Harolyn Rutherford, NP [facility]  08/01/2023 10:30 AM [facility] US[facility]  09/11/2023 10:40 AM Tobb, Lavona Mound, DO [facility] None    Milas Hock, MD

## 2023-06-25 NOTE — Telephone Encounter (Signed)
Patient advised to pay $15.00 FMLA form payment.

## 2023-06-25 NOTE — Progress Notes (Signed)
Pt c/o feeling light headed 

## 2023-06-29 ENCOUNTER — Ambulatory Visit: Payer: No Typology Code available for payment source | Admitting: Cardiology

## 2023-07-02 ENCOUNTER — Telehealth: Payer: Self-pay

## 2023-07-02 NOTE — Telephone Encounter (Signed)
I spoke with the patient and her partner Demetrio Lapping 06/06/1997) regarding [name]'s recent genetic testing results. He had genetic testing through [facility] for hereditary spherocytosis. 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 This is associated with autosomal dominant hereditary spherocytosis. We assume that this variant was inherited from [name]'s mother, due to several maternal family members being affected with hereditary spherocytosis; however, we can not be certain of this without follow-up testing for relatives. We discussed that there is a 50% chance that this current or future pregnancies would inherit this variant and be affected with the condition. We discussed that prenatal diagnostic testing is available. The technical aspects, benefits, risks, and limitations including the 1 in 500 risk for preterm delivery with amniocentesis were reviewed with the couple. We also discussed that genetic testing can be performed postnatally. The couple elected amniocentesis. I will coordinate that appointment.   Hereditary spherocytosis is a genetic condition and type of congenital hemolytic anemia. Genetic mutations that cause abnormal membrane and cytoskeletal proteins cause red blood cells to form an abnormal spherical shape instead of the typical biconcave shape. The onset of the condition can be as early as the neonatal period and symptoms of the condition include jaundice, anemia, splenomegaly, and cholelithiasis.    Sheppard Plumber, MS Genetic Counselor [facility] for Maternal Fetal Care [phone]

## 2023-07-03 ENCOUNTER — Telehealth: Payer: Self-pay

## 2023-07-03 ENCOUNTER — Other Ambulatory Visit: Payer: Self-pay | Admitting: *Deleted

## 2023-07-03 DIAGNOSIS — R898 Other abnormal findings in specimens from other organs, systems and tissues: Secondary | ICD-10-CM

## 2023-07-03 NOTE — Telephone Encounter (Signed)
I left a voicemail asking patient to call back to schedule an amniocentesis appointment.

## 2023-07-03 NOTE — Telephone Encounter (Signed)
Patient called back. She is available for an amniocentesis appointment on 07/09/2023 at 9:15 am. I let the schedulers know.

## 2023-07-09 ENCOUNTER — Other Ambulatory Visit: Payer: Self-pay | Admitting: Obstetrics

## 2023-07-09 ENCOUNTER — Ambulatory Visit: Payer: No Typology Code available for payment source

## 2023-07-09 ENCOUNTER — Encounter: Payer: Self-pay | Admitting: *Deleted

## 2023-07-09 ENCOUNTER — Ambulatory Visit: Payer: No Typology Code available for payment source | Admitting: *Deleted

## 2023-07-09 ENCOUNTER — Other Ambulatory Visit: Payer: Self-pay

## 2023-07-09 ENCOUNTER — Ambulatory Visit: Payer: No Typology Code available for payment source | Attending: Obstetrics

## 2023-07-09 DIAGNOSIS — O321XX0 Maternal care for breech presentation, not applicable or unspecified: Secondary | ICD-10-CM | POA: Insufficient documentation

## 2023-07-09 DIAGNOSIS — Z1379 Encounter for other screening for genetic and chromosomal anomalies: Secondary | ICD-10-CM | POA: Diagnosis not present

## 2023-07-09 DIAGNOSIS — R898 Other abnormal findings in specimens from other organs, systems and tissues: Secondary | ICD-10-CM

## 2023-07-09 DIAGNOSIS — Z832 Family history of diseases of the blood and blood-forming organs and certain disorders involving the immune mechanism: Secondary | ICD-10-CM | POA: Insufficient documentation

## 2023-07-09 DIAGNOSIS — D580 Hereditary spherocytosis: Secondary | ICD-10-CM | POA: Insufficient documentation

## 2023-07-09 DIAGNOSIS — Z3A24 24 weeks gestation of pregnancy: Secondary | ICD-10-CM | POA: Insufficient documentation

## 2023-07-09 DIAGNOSIS — O352XX0 Maternal care for (suspected) hereditary disease in fetus, not applicable or unspecified: Secondary | ICD-10-CM | POA: Insufficient documentation

## 2023-07-09 DIAGNOSIS — E669 Obesity, unspecified: Secondary | ICD-10-CM

## 2023-07-09 DIAGNOSIS — O99212 Obesity complicating pregnancy, second trimester: Secondary | ICD-10-CM

## 2023-07-12 ENCOUNTER — Ambulatory Visit: Payer: No Typology Code available for payment source | Admitting: Obstetrics and Gynecology

## 2023-07-12 ENCOUNTER — Encounter: Payer: Self-pay | Admitting: Obstetrics and Gynecology

## 2023-07-12 VITALS — BP 114/78 | HR 98 | Wt 248.0 lb

## 2023-07-12 DIAGNOSIS — E569 Vitamin deficiency, unspecified: Secondary | ICD-10-CM

## 2023-07-12 DIAGNOSIS — O99282 Endocrine, nutritional and metabolic diseases complicating pregnancy, second trimester: Secondary | ICD-10-CM

## 2023-07-12 DIAGNOSIS — Z3481 Encounter for supervision of other normal pregnancy, first trimester: Secondary | ICD-10-CM

## 2023-07-12 DIAGNOSIS — D580 Hereditary spherocytosis: Secondary | ICD-10-CM

## 2023-07-12 DIAGNOSIS — F419 Anxiety disorder, unspecified: Secondary | ICD-10-CM

## 2023-07-12 DIAGNOSIS — F32A Depression, unspecified: Secondary | ICD-10-CM

## 2023-07-12 DIAGNOSIS — Z3A25 25 weeks gestation of pregnancy: Secondary | ICD-10-CM

## 2023-07-12 DIAGNOSIS — Z1332 Encounter for screening for maternal depression: Secondary | ICD-10-CM

## 2023-07-12 DIAGNOSIS — E669 Obesity, unspecified: Secondary | ICD-10-CM

## 2023-07-12 DIAGNOSIS — Z832 Family history of diseases of the blood and blood-forming organs and certain disorders involving the immune mechanism: Secondary | ICD-10-CM

## 2023-07-12 DIAGNOSIS — O99342 Other mental disorders complicating pregnancy, second trimester: Secondary | ICD-10-CM

## 2023-07-12 DIAGNOSIS — O99212 Obesity complicating pregnancy, second trimester: Secondary | ICD-10-CM

## 2023-07-12 NOTE — Progress Notes (Signed)
   PRENATAL VISIT NOTE  Subjective:  Angela Blackwell is a 27 y.o. G3P0020 at [redacted]w[redacted]d being seen today for ongoing prenatal care.  She is currently monitored for the following issues for this low-risk pregnancy and has Vitamin D deficiency; Supervision of normal pregnancy; BMI 38.0-38.9,adult; Obesity in pregnancy; and FOB with hereditary spherocytosis on their problem list.  Patient reports no complaints.  Contractions: Not present. Vag. Bleeding: None.  Movement: Present. Denies leaking of fluid.   The following portions of the patient's history were reviewed and updated as appropriate: allergies, current medications, past family history, past medical history, past social history, past surgical history and problem list.   Objective:   Vitals:   07/12/23 1351  BP: 114/78  Pulse: 98  Weight: 248 lb (112.5 kg)    Fetal Status: Fetal Heart Rate (bpm): 141   Movement: Present     General:  Alert, oriented and cooperative. Patient is in no acute distress.  Skin: Skin is warm and dry. No rash noted.   Cardiovascular: Normal heart rate noted  Respiratory: Normal respiratory effort, no problems with respiration noted  Abdomen: Soft, gravid, appropriate for gestational age.  Pain/Pressure: Absent     Pelvic: Cervical exam deferred        Extremities: Normal range of motion.  Edema: None  Mental Status: Normal mood and affect. Normal behavior. Normal judgment and thought content.   Assessment and Plan:  Pregnancy: G3P0020 at [redacted]w[redacted]d 1. Anxiety and depression Mood is much improved with zoloft and buspar.   2. 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 This is associated with autosomal dominant hereditary spherocytosis. Pt s/p amniocentesis on 11/4. Results pending. Expect to have them 2 weeks from amnio.   3. Pregnancy with 25 completed weeks gestation   Preterm labor  symptoms and general obstetric precautions including but not limited to vaginal bleeding, contractions, leaking of fluid and fetal movement were reviewed in detail with the patient. Please refer to After Visit Summary for other counseling recommendations.   Return in about 2 weeks (around 07/26/2023) for OB VISIT, MD or APP, 2 hr GTT/28w labs.  Future Appointments  Date Time Provider Department Center  07/24/2023  8:10 AM Rasch, Harolyn Rutherford, NP [facility]  08/01/2023 10:30 AM [facility] US5 [facility]  08/07/2023 10:30 AM Rasch, Harolyn Rutherford, NP [facility]  08/21/2023 10:30 AM Rasch, Harolyn Rutherford, NP [facility]  09/04/2023 10:30 AM Rasch, Harolyn Rutherford, NP [facility]  09/11/2023 10:40 AM Thomasene Ripple, DO [facility] None    Milas Hock, MD

## 2023-07-12 NOTE — Progress Notes (Signed)
PHQ 9 Score: 5 GAD 7 Score: 7

## 2023-07-18 ENCOUNTER — Telehealth: Payer: Self-pay

## 2023-07-18 NOTE — Telephone Encounter (Signed)
I spoke with the patient to discuss the recent amniocentesis results. The lab reports that the fetus does carry the pathogenic variant c.2160 G>A p.(W870*) in the SPTB gene that is present in Lawson reproductive partner. Therefore, this pregnancy is affected with hereditary spherocytosis. Please see report for details.    Sheppard Plumber, MS Genetic Counselor [facility] [phone]

## 2023-07-19 ENCOUNTER — Other Ambulatory Visit: Payer: Self-pay | Admitting: *Deleted

## 2023-07-19 DIAGNOSIS — D580 Hereditary spherocytosis: Secondary | ICD-10-CM

## 2023-07-24 ENCOUNTER — Ambulatory Visit: Payer: No Typology Code available for payment source | Admitting: Obstetrics and Gynecology

## 2023-07-24 VITALS — BP 120/78 | HR 105 | Wt 251.0 lb

## 2023-07-24 DIAGNOSIS — E669 Obesity, unspecified: Secondary | ICD-10-CM

## 2023-07-24 DIAGNOSIS — Z141 Cystic fibrosis carrier: Secondary | ICD-10-CM

## 2023-07-24 DIAGNOSIS — O99282 Endocrine, nutritional and metabolic diseases complicating pregnancy, second trimester: Secondary | ICD-10-CM

## 2023-07-24 DIAGNOSIS — Z1331 Encounter for screening for depression: Secondary | ICD-10-CM | POA: Diagnosis not present

## 2023-07-24 DIAGNOSIS — E559 Vitamin D deficiency, unspecified: Secondary | ICD-10-CM

## 2023-07-24 DIAGNOSIS — Z3490 Encounter for supervision of normal pregnancy, unspecified, unspecified trimester: Secondary | ICD-10-CM

## 2023-07-24 DIAGNOSIS — O99212 Obesity complicating pregnancy, second trimester: Secondary | ICD-10-CM

## 2023-07-24 DIAGNOSIS — Z3A26 26 weeks gestation of pregnancy: Secondary | ICD-10-CM

## 2023-07-24 NOTE — Progress Notes (Signed)
   PRENATAL VISIT NOTE  Subjective:  Angela Blackwell is a 27 y.o. G3P0020 at [redacted]w[redacted]d being seen today for ongoing prenatal care.  She is currently monitored for the following issues for this low-risk pregnancy and has Vitamin D deficiency; Supervision of normal pregnancy; BMI 38.0-38.9,adult; Obesity in pregnancy; and FOB with hereditary spherocytosis on their problem list.  Patient reports  Continued dizziness since the first trimester. Has had full workup with neuro and cardiology. Symptoms are stable and have not worsened .  Contractions: Not present. Vag. Bleeding: None.  Movement: Present. Denies leaking of fluid.   The following portions of the patient's history were reviewed and updated as appropriate: allergies, current medications, past family history, past medical history, past social history, past surgical history and problem list.   Objective:   Vitals:   07/24/23 0806  BP: 120/78  Pulse: (!) 105  Weight: 251 lb (113.9 kg)    Fetal Status: Fetal Heart Rate (bpm): 141   Movement: Present     General:  Alert, oriented and cooperative. Patient is in no acute distress.  Skin: Skin is warm and dry. No rash noted.   Cardiovascular: Normal heart rate noted  Respiratory: Normal respiratory effort, no problems with respiration noted  Abdomen: Soft, gravid, appropriate for gestational age.  Pain/Pressure: Absent     Pelvic: Cervical exam deferred        Extremities: Normal range of motion.  Edema: None  Mental Status: Normal mood and affect. Normal behavior. Normal judgment and thought content.   Assessment and Plan:  Pregnancy: G3P0020 at [redacted]w[redacted]d 1. Encounter for supervision of normal pregnancy, antepartum, unspecified gravidity  - Glucose Tolerance, 2 Hours w/1 Hour - HIV antibody (with reflex) - CBC - RPR  Preterm labor symptoms and general obstetric precautions including but not limited to vaginal bleeding, contractions, leaking of fluid and fetal movement were reviewed in detail  with the patient. Please refer to After Visit Summary for other counseling recommendations.   No follow-ups on file.  Future Appointments  Date Time Provider Department Center  07/26/2023  3:30 PM [facility] US5 [facility]  08/01/2023 10:30 AM [facility] US5 [facility]  08/07/2023 10:30 AM Danae Oland, Harolyn Rutherford, NP [facility]  08/21/2023 10:30 AM Lynford Espinoza, Harolyn Rutherford, NP [facility]  09/04/2023 10:30 AM Jaelyn Bourgoin, Harolyn Rutherford, NP [facility]  09/11/2023 10:40 AM Thomasene Ripple, DO [facility] None    Venia Carbon, NP

## 2023-07-26 ENCOUNTER — Ambulatory Visit: Payer: No Typology Code available for payment source | Attending: Obstetrics

## 2023-07-26 ENCOUNTER — Other Ambulatory Visit: Payer: Self-pay

## 2023-07-26 ENCOUNTER — Other Ambulatory Visit: Payer: Self-pay | Admitting: *Deleted

## 2023-07-26 DIAGNOSIS — O368220 Fetal anemia and thrombocytopenia, second trimester, not applicable or unspecified: Secondary | ICD-10-CM | POA: Diagnosis not present

## 2023-07-26 DIAGNOSIS — E669 Obesity, unspecified: Secondary | ICD-10-CM

## 2023-07-26 DIAGNOSIS — Z8489 Family history of other specified conditions: Secondary | ICD-10-CM | POA: Insufficient documentation

## 2023-07-26 DIAGNOSIS — D580 Hereditary spherocytosis: Secondary | ICD-10-CM | POA: Diagnosis present

## 2023-07-26 DIAGNOSIS — Z3A27 27 weeks gestation of pregnancy: Secondary | ICD-10-CM | POA: Diagnosis not present

## 2023-07-26 DIAGNOSIS — O352XX0 Maternal care for (suspected) hereditary disease in fetus, not applicable or unspecified: Secondary | ICD-10-CM

## 2023-07-26 DIAGNOSIS — O99212 Obesity complicating pregnancy, second trimester: Secondary | ICD-10-CM | POA: Diagnosis not present

## 2023-07-26 DIAGNOSIS — Z369 Encounter for antenatal screening, unspecified: Secondary | ICD-10-CM | POA: Diagnosis not present

## 2023-07-27 ENCOUNTER — Other Ambulatory Visit: Payer: Self-pay | Admitting: *Deleted

## 2023-07-27 DIAGNOSIS — D580 Hereditary spherocytosis: Secondary | ICD-10-CM

## 2023-08-01 ENCOUNTER — Ambulatory Visit: Payer: No Typology Code available for payment source | Attending: Maternal & Fetal Medicine

## 2023-08-01 ENCOUNTER — Other Ambulatory Visit: Payer: Self-pay

## 2023-08-01 ENCOUNTER — Other Ambulatory Visit: Payer: Self-pay | Admitting: *Deleted

## 2023-08-01 DIAGNOSIS — Z8489 Family history of other specified conditions: Secondary | ICD-10-CM | POA: Diagnosis not present

## 2023-08-01 DIAGNOSIS — Z362 Encounter for other antenatal screening follow-up: Secondary | ICD-10-CM | POA: Insufficient documentation

## 2023-08-01 DIAGNOSIS — Z832 Family history of diseases of the blood and blood-forming organs and certain disorders involving the immune mechanism: Secondary | ICD-10-CM

## 2023-08-01 DIAGNOSIS — O358XX0 Maternal care for other (suspected) fetal abnormality and damage, not applicable or unspecified: Secondary | ICD-10-CM

## 2023-08-01 DIAGNOSIS — E669 Obesity, unspecified: Secondary | ICD-10-CM

## 2023-08-01 DIAGNOSIS — O99212 Obesity complicating pregnancy, second trimester: Secondary | ICD-10-CM | POA: Insufficient documentation

## 2023-08-01 DIAGNOSIS — Z3A27 27 weeks gestation of pregnancy: Secondary | ICD-10-CM | POA: Diagnosis not present

## 2023-08-01 DIAGNOSIS — Z3492 Encounter for supervision of normal pregnancy, unspecified, second trimester: Secondary | ICD-10-CM | POA: Insufficient documentation

## 2023-08-01 DIAGNOSIS — D580 Hereditary spherocytosis: Secondary | ICD-10-CM | POA: Insufficient documentation

## 2023-08-07 ENCOUNTER — Ambulatory Visit (INDEPENDENT_AMBULATORY_CARE_PROVIDER_SITE_OTHER): Payer: No Typology Code available for payment source | Admitting: Obstetrics and Gynecology

## 2023-08-07 VITALS — BP 130/82 | HR 116 | Wt 255.0 lb

## 2023-08-07 DIAGNOSIS — Z23 Encounter for immunization: Secondary | ICD-10-CM

## 2023-08-07 DIAGNOSIS — Z3A28 28 weeks gestation of pregnancy: Secondary | ICD-10-CM

## 2023-08-07 DIAGNOSIS — Z3490 Encounter for supervision of normal pregnancy, unspecified, unspecified trimester: Secondary | ICD-10-CM

## 2023-08-07 DIAGNOSIS — E669 Obesity, unspecified: Secondary | ICD-10-CM

## 2023-08-07 DIAGNOSIS — O99213 Obesity complicating pregnancy, third trimester: Secondary | ICD-10-CM

## 2023-08-07 DIAGNOSIS — O99113 Other diseases of the blood and blood-forming organs and certain disorders involving the immune mechanism complicating pregnancy, third trimester: Secondary | ICD-10-CM

## 2023-08-07 DIAGNOSIS — D580 Hereditary spherocytosis: Secondary | ICD-10-CM

## 2023-08-07 LAB — CBC
Hematocrit: 36.3 % (ref 34.0–46.6)
Hemoglobin: 11.8 g/dL (ref 11.1–15.9)
MCH: 29.1 pg (ref 26.6–33.0)
MCHC: 32.5 g/dL (ref 31.5–35.7)
MCV: 89 fL (ref 79–97)
Platelets: 417 10*3/uL (ref 150–450)
RBC: 4.06 x10E6/uL (ref 3.77–5.28)
RDW: 11.2 % — ABNORMAL LOW (ref 11.7–15.4)
WBC: 14.8 10*3/uL — ABNORMAL HIGH (ref 3.4–10.8)

## 2023-08-07 LAB — RPR: RPR Ser Ql: NONREACTIVE

## 2023-08-07 LAB — HIV ANTIBODY (ROUTINE TESTING W REFLEX): HIV Screen 4th Generation wRfx: NONREACTIVE

## 2023-08-07 LAB — GLUCOSE TOLERANCE, 2 HOURS W/ 1HR
Glucose, 1 hour: 177 mg/dL (ref 70–179)
Glucose, 2 hour: 139 mg/dL (ref 70–152)
Glucose, Fasting: 88 mg/dL (ref 70–91)

## 2023-08-07 MED ORDER — LANCETS MISC. MISC
1.0000 | Freq: Three times a day (TID) | 3 refills | Status: AC
Start: 2023-08-07 — End: 2023-09-06

## 2023-08-07 MED ORDER — BLOOD GLUCOSE MONITORING SUPPL DEVI: 1.0000 | Freq: Three times a day (TID) | 0 refills | Status: DC

## 2023-08-07 MED ORDER — BLOOD GLUCOSE TEST VI STRP: 1.0000 | ORAL_STRIP | Freq: Three times a day (TID) | 0 refills | Status: AC

## 2023-08-07 MED ORDER — LANCET DEVICE MISC: 1.0000 | Freq: Three times a day (TID) | 3 refills | Status: AC

## 2023-08-07 NOTE — Patient Instructions (Signed)
Check your Blood sugar 4 x per day  Fasting- before you eat anything 2 hours after every meal.  Goal blood sugar fasting is less than 95 Goal blood sugar post meals is less than 120  3 meals and 3 snacks every single day.

## 2023-08-07 NOTE — Progress Notes (Signed)
   PRENATAL VISIT NOTE  Subjective:  Angela Blackwell is a 27 y.o. G3P0020 at [redacted]w[redacted]d being seen today for ongoing prenatal care.  She is currently monitored for the following issues for this low-risk pregnancy and has Vitamin D deficiency; Supervision of normal pregnancy; BMI 38.0-38.9,adult; Obesity in pregnancy; and FOB with hereditary spherocytosis on their problem list.  Patient reports  weakness, fatigue, overall feels bad. 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 Concerned about this degree of work while pregnant.  .  Contractions: Not present. Vag. Bleeding: None.  Movement: Present. Denies leaking of fluid.   The following portions of the patient's history were reviewed and updated as appropriate: allergies, current medications, past family history, past medical history, past social history, past surgical history and problem list.   Objective:   Vitals:   08/07/23 1027  BP: 130/82  Pulse: (!) 116  Weight: 115.7 kg    Fetal Status: Fetal Heart Rate (bpm): 145   Movement: Present     General:  Alert, oriented and cooperative. Patient is in no acute distress.  Skin: Skin is warm and dry. No rash noted.   Cardiovascular: Normal heart rate noted  Respiratory: Normal respiratory effort, no problems with respiration noted  Abdomen: Soft, gravid, appropriate for gestational age.  Pain/Pressure: Absent     Pelvic: Cervical exam deferred        Extremities: Normal range of motion.  Edema: None  Mental Status: Normal mood and affect. Normal behavior. Normal judgment and thought content.   Assessment and Plan:  Pregnancy: G3P0020 at [redacted]w[redacted]d  1. Encounter for supervision of normal pregnancy, antepartum, unspecified gravidity  Passed 2 hour GTT however 1 hour just at cut off (177) Given new feelings of fatigue, weakness I recommended she check her BS at home for 2 weeks and keep a log.  Pre diabetes and Type 2 diabetes runs in her family significantly. Will review BS log in 2  weeks.  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   2. FOB with hereditary spherocytosis  Confirmed fetal diagnoses with Amnio  Has been with Genetic counselor through [facility].  Continue weekly dopplers with MFM   Preterm labor symptoms and general obstetric precautions including but not limited to vaginal bleeding, contractions, leaking of fluid and fetal movement were reviewed in detail with the patient. Please refer to After Visit Summary for other counseling recommendations.   Return 2 weeks with me please.  Future Appointments  Date Time Provider Department Center  08/10/2023  3:30 PM [facility] US4 [facility]  08/17/2023  7:30 AM [facility] US5 [facility]  08/21/2023 10:30 AM Aveer Bartow, Harolyn Rutherford, NP [facility]  08/30/2023  7:30 AM [facility] US1 [facility]  09/04/2023 10:30 AM Nimo Verastegui, Harolyn Rutherford, NP [facility]  09/11/2023 10:40 AM Thomasene Ripple, DO [facility] None  09/18/2023 10:30 AM Annasophia Crocker, Harolyn Rutherford, NP [facility]  10/02/2023 10:30 AM Ra Pfiester, Harolyn Rutherford, NP [facility]    Angela Carbon, NP

## 2023-08-10 ENCOUNTER — Ambulatory Visit: Payer: No Typology Code available for payment source | Attending: Maternal & Fetal Medicine

## 2023-08-10 ENCOUNTER — Other Ambulatory Visit: Payer: Self-pay | Admitting: Maternal & Fetal Medicine

## 2023-08-10 DIAGNOSIS — O99212 Obesity complicating pregnancy, second trimester: Secondary | ICD-10-CM | POA: Diagnosis present

## 2023-08-10 DIAGNOSIS — O99213 Obesity complicating pregnancy, third trimester: Secondary | ICD-10-CM | POA: Diagnosis not present

## 2023-08-10 DIAGNOSIS — O3519X0 Maternal care for (suspected) chromosomal abnormality in fetus, other chromosomal abnormality, not applicable or unspecified: Secondary | ICD-10-CM | POA: Diagnosis not present

## 2023-08-10 DIAGNOSIS — E669 Obesity, unspecified: Secondary | ICD-10-CM | POA: Diagnosis not present

## 2023-08-10 DIAGNOSIS — O26893 Other specified pregnancy related conditions, third trimester: Secondary | ICD-10-CM | POA: Insufficient documentation

## 2023-08-10 DIAGNOSIS — Z832 Family history of diseases of the blood and blood-forming organs and certain disorders involving the immune mechanism: Secondary | ICD-10-CM

## 2023-08-10 DIAGNOSIS — Z3A29 29 weeks gestation of pregnancy: Secondary | ICD-10-CM | POA: Insufficient documentation

## 2023-08-17 ENCOUNTER — Ambulatory Visit: Payer: No Typology Code available for payment source | Attending: Obstetrics

## 2023-08-17 ENCOUNTER — Other Ambulatory Visit: Payer: Self-pay | Admitting: *Deleted

## 2023-08-17 ENCOUNTER — Other Ambulatory Visit: Payer: Self-pay

## 2023-08-17 DIAGNOSIS — O3519X9 Maternal care for (suspected) chromosomal abnormality in fetus, other chromosomal abnormality, other fetus: Secondary | ICD-10-CM

## 2023-08-17 DIAGNOSIS — O352XX9 Maternal care for (suspected) hereditary disease in fetus, other fetus: Secondary | ICD-10-CM | POA: Diagnosis not present

## 2023-08-17 DIAGNOSIS — Z832 Family history of diseases of the blood and blood-forming organs and certain disorders involving the immune mechanism: Secondary | ICD-10-CM | POA: Insufficient documentation

## 2023-08-17 DIAGNOSIS — Z362 Encounter for other antenatal screening follow-up: Secondary | ICD-10-CM | POA: Diagnosis not present

## 2023-08-17 DIAGNOSIS — Z3A30 30 weeks gestation of pregnancy: Secondary | ICD-10-CM

## 2023-08-17 DIAGNOSIS — Z8489 Family history of other specified conditions: Secondary | ICD-10-CM

## 2023-08-17 DIAGNOSIS — D580 Hereditary spherocytosis: Secondary | ICD-10-CM | POA: Diagnosis present

## 2023-08-17 DIAGNOSIS — O99213 Obesity complicating pregnancy, third trimester: Secondary | ICD-10-CM

## 2023-08-17 DIAGNOSIS — E669 Obesity, unspecified: Secondary | ICD-10-CM

## 2023-08-17 DIAGNOSIS — O352XX0 Maternal care for (suspected) hereditary disease in fetus, not applicable or unspecified: Secondary | ICD-10-CM

## 2023-08-17 DIAGNOSIS — O3519X0 Maternal care for (suspected) chromosomal abnormality in fetus, other chromosomal abnormality, not applicable or unspecified: Secondary | ICD-10-CM | POA: Diagnosis not present

## 2023-08-21 ENCOUNTER — Other Ambulatory Visit: Payer: Self-pay

## 2023-08-21 ENCOUNTER — Encounter (HOSPITAL_COMMUNITY): Payer: Self-pay | Admitting: Obstetrics & Gynecology

## 2023-08-21 ENCOUNTER — Ambulatory Visit (INDEPENDENT_AMBULATORY_CARE_PROVIDER_SITE_OTHER): Payer: Self-pay | Admitting: Obstetrics and Gynecology

## 2023-08-21 ENCOUNTER — Inpatient Hospital Stay (HOSPITAL_COMMUNITY)
Admission: AD | Admit: 2023-08-21 | Discharge: 2023-08-21 | Disposition: A | Discharge status: Home / Self Care | Payer: No Typology Code available for payment source | Attending: Obstetrics & Gynecology | Admitting: Obstetrics & Gynecology

## 2023-08-21 VITALS — BP 118/79 | HR 120 | Temp 98.3°F | Wt 258.0 lb

## 2023-08-21 DIAGNOSIS — O99343 Other mental disorders complicating pregnancy, third trimester: Secondary | ICD-10-CM | POA: Diagnosis not present

## 2023-08-21 DIAGNOSIS — Z3A36 36 weeks gestation of pregnancy: Secondary | ICD-10-CM | POA: Diagnosis not present

## 2023-08-21 DIAGNOSIS — O26893 Other specified pregnancy related conditions, third trimester: Secondary | ICD-10-CM | POA: Insufficient documentation

## 2023-08-21 DIAGNOSIS — Z3689 Encounter for other specified antenatal screening: Secondary | ICD-10-CM

## 2023-08-21 DIAGNOSIS — Z3A30 30 weeks gestation of pregnancy: Secondary | ICD-10-CM | POA: Insufficient documentation

## 2023-08-21 DIAGNOSIS — R42 Dizziness and giddiness: Secondary | ICD-10-CM | POA: Insufficient documentation

## 2023-08-21 DIAGNOSIS — R000 Tachycardia, unspecified: Secondary | ICD-10-CM

## 2023-08-21 DIAGNOSIS — O99333 Smoking (tobacco) complicating pregnancy, third trimester: Secondary | ICD-10-CM | POA: Insufficient documentation

## 2023-08-21 DIAGNOSIS — Z3490 Encounter for supervision of normal pregnancy, unspecified, unspecified trimester: Secondary | ICD-10-CM

## 2023-08-21 DIAGNOSIS — O24415 Gestational diabetes mellitus in pregnancy, controlled by oral hypoglycemic drugs: Secondary | ICD-10-CM

## 2023-08-21 DIAGNOSIS — R55 Syncope and collapse: Secondary | ICD-10-CM

## 2023-08-21 LAB — COMPREHENSIVE METABOLIC PANEL
ALT: 13 U/L (ref 0–44)
AST: 14 U/L — ABNORMAL LOW (ref 15–41)
Albumin: 2.4 g/dL — ABNORMAL LOW (ref 3.5–5.0)
Alkaline Phosphatase: 76 U/L (ref 38–126)
Anion gap: 13 (ref 5–15)
BUN: 8 mg/dL (ref 6–20)
CO2: 22 mmol/L (ref 22–32)
Calcium: 8.8 mg/dL — ABNORMAL LOW (ref 8.9–10.3)
Chloride: 102 mmol/L (ref 98–111)
Creatinine, Ser: 0.65 mg/dL (ref 0.44–1.00)
GFR, Estimated: >60 mL/min (ref >60)
Glucose, Bld: 135 mg/dL — ABNORMAL HIGH (ref 70–99)
Potassium: 3.8 mmol/L (ref 3.5–5.1)
Sodium: 137 mmol/L (ref 135–145)
Total Bilirubin: 0.4 mg/dL (ref <1.2)
Total Protein: 6.2 g/dL — ABNORMAL LOW (ref 6.5–8.1)

## 2023-08-21 LAB — BRAIN NATRIURETIC PEPTIDE: B Natriuretic Peptide: 11 pg/mL (ref 0.0–100.0)

## 2023-08-21 LAB — CBC
HCT: 34.5 % — ABNORMAL LOW (ref 36.0–46.0)
Hemoglobin: 11.4 g/dL — ABNORMAL LOW (ref 12.0–15.0)
MCH: 29.1 pg (ref 26.0–34.0)
MCHC: 33 g/dL (ref 30.0–36.0)
MCV: 88 fL (ref 80.0–100.0)
Platelets: 414 10*3/uL — ABNORMAL HIGH (ref 150–400)
RBC: 3.92 MIL/uL (ref 3.87–5.11)
RDW: 12.7 % (ref 11.5–15.5)
WBC: 15.3 10*3/uL — ABNORMAL HIGH (ref 4.0–10.5)
nRBC: 0 % (ref 0.0–0.2)

## 2023-08-21 LAB — GLUCOSE, CAPILLARY: Glucose-Capillary: 141 mg/dL — ABNORMAL HIGH (ref 70–99)

## 2023-08-21 LAB — TROPONIN I (HIGH SENSITIVITY): Troponin I (High Sensitivity): 8 ng/L (ref <18)

## 2023-08-21 MED ORDER — METFORMIN HCL 500 MG PO TABS: 500.0000 mg | ORAL_TABLET | Freq: Every day | ORAL | 2 refills | Status: DC

## 2023-08-21 NOTE — Progress Notes (Addendum)
Temp: 98.3  Laying: BP:127/84 Pulse:105 Standing: BP: 131/85 Pulse: 139 Sitting: BP: 130/84 Pulse: 120  Pt is tearful stating she does not feel well, she is dizzy and is profusely sweating.

## 2023-08-21 NOTE — MAU Provider Note (Signed)
History     CSN: [id number]  Arrival date and time: 08/21/23 1226   Event Date/Time   First Provider Initiated Contact with Patient 08/21/23 1336      Chief Complaint  Patient presents with   Increased Pulse   Light headed   HPI Patient is 27 y.o. [id number] [redacted]w[redacted]d here with complaints of dizziness lightheadedness and presyncope.  Patient reports she was at her outpatient OB provider there is no like office which is [facility] and she was noted to have an elevated heart rate and beats forementioned.  They performed orthostatic vital signs and noted that the patient's heart rate went up to the 130s to 140 when she was lying back and she got profusely sweating.  Patient reports that this is happening at home as well.  She reports that she is not able to lie flat comfortably.  She reports that this has been getting progressively worse pregnancy has progressed.  She has seen cardiology for the same complaint and completed monitoring with echo and ziopatch.   +FM, denies LOF, VB, contractions, vaginal discharge.   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   PTSD (post-traumatic stress disorder) 07/18/2012   Weight loss counseling, encounter for 01/06/2022    Past Surgical History:  Procedure Laterality Date   CERVICAL BIOPSY  05/23/2021   LGSIL- pap 04/2021   TONSILLECTOMY  2021   TONSILLECTOMY AND ADENOIDECTOMY     WISDOM TOOTH EXTRACTION      Family History  Problem Relation Age of Onset   Hyperlipidemia Mother    Miscarriages / Stillbirths Mother         miscarrige   Hypertension Mother    Osteoarthritis Father    Hypertension Father    Diabetes Father    Hyperlipidemia Father    Depression Father    Mental illness Father    Skin cancer Father    ADD / ADHD Half-Brother    PDD Half-Brother    Bipolar disorder Paternal Aunt    Anxiety disorder Paternal Aunt    Anxiety disorder Paternal Aunt    Depression Paternal Aunt    Arthritis Maternal Grandmother    Breast cancer Maternal Grandmother    Arthritis Maternal Grandfather    Prostate cancer Maternal Grandfather    Mental illness Paternal Grandmother    Hypertension Paternal Grandmother    Hyperlipidemia Paternal Grandmother    Asthma Paternal Grandmother    Arthritis Paternal Grandmother    Diabetes Paternal Grandmother    Depression Paternal Grandmother    Lung cancer Paternal Grandmother        smoker   Cancer Paternal Grandfather        ?   Lung cancer Paternal Grandfather        smoker   Hyperlipidemia Paternal Grandfather  Hearing loss Paternal Grandfather    Hypertension Paternal Grandfather    COPD Paternal Grandfather    Kidney disease Paternal Grandfather    Heart attack Paternal Grandfather     Social History   Tobacco Use   Smoking status: Every Day    Current packs/day: 0.00    Types: Cigarettes    Last attempt to quit: 09/2018    Years since quitting: 4.9   Smokeless tobacco: Former  Building services engineer status: Every Day   Substances: Nicotine  Substance Use Topics   Alcohol use: Not Currently   Drug use: Never    Allergies:  Allergies  Allergen Reactions   Hydrocodone-Acetaminophen Other (See Comments)    Anxiety   Oxycodone Other (See Comments)    anxiety    Medications Prior to Admission  Medication Sig Dispense Refill Last Dose/Taking   aspirin EC 81 MG tablet Take 1 tablet (81 mg total) by mouth daily. Swallow whole. 30 tablet 12 08/21/2023   busPIRone (BUSPAR) 5 MG tablet Take 1 tablet (5 mg total) by mouth 2 (two) times daily.  60 tablet 3 08/21/2023   Cyanocobalamin (B-12 PO) Take by mouth.   08/21/2023   MAGNESIUM PO Take by mouth.   08/21/2023   Prenatal MV & Min w/FA-DHA (PRENATAL GUMMIES PO)    08/21/2023   sertraline (ZOLOFT) 50 MG tablet Take 1 tablet (50 mg total) by mouth daily. 90 tablet 3 08/21/2023   aspirin-acetaminophen-caffeine (EXCEDRIN MIGRAINE) 250-250-65 MG tablet Take by mouth every 6 (six) hours as needed for headache.      Blood Glucose Monitoring Suppl DEVI 1 each by Does not apply route in the morning, at noon, and at bedtime. May substitute to any manufacturer covered by patient's insurance. 1 each 0    cyclobenzaprine (FLEXERIL) 10 MG tablet Take 1 tablet (10 mg total) by mouth every 8 (eight) hours as needed for muscle spasms (headaches). 30 tablet 1    Glucose Blood (BLOOD GLUCOSE TEST STRIPS) STRP 1 each by In Vitro route in the morning, at noon, and at bedtime. May substitute to any manufacturer covered by patient's insurance. 90 each 0    Lancet Device MISC 1 each by Does not apply route in the morning, at noon, and at bedtime. May substitute to any manufacturer covered by patient's insurance. 1 each 3    Lancets Misc. MISC 1 each by Does not apply route in the morning, at noon, and at bedtime. May substitute to any manufacturer covered by patient's insurance. 100 each 3    metFORMIN (GLUCOPHAGE) 500 MG tablet Take 1 tablet (500 mg total) by mouth daily with breakfast. 90 tablet 2     Review of Systems  Constitutional:  Negative for chills and fever.  HENT:  Negative for congestion and sore throat.   Eyes:  Negative for pain and visual disturbance.  Respiratory:  Positive for shortness of breath. Negative for cough and chest tightness.   Cardiovascular:  Negative for chest pain.  Gastrointestinal:  Negative for abdominal pain, diarrhea, nausea and vomiting.  Endocrine: Negative for cold intolerance and heat intolerance.  Genitourinary:  Negative for dysuria and flank pain.   Musculoskeletal:  Negative for back pain.  Skin:  Negative for rash.  Allergic/Immunologic: Negative for food allergies.  Neurological:  Positive for dizziness, light-headedness and headaches.  Psychiatric/Behavioral:  Negative for agitation.    Physical Exam   Blood pressure 124/81, pulse (!) 114, temperature 97.7 F (36.5 C), temperature source Oral, resp.  rate 18, height 5\' 2"  (1.575 m), weight 118.4 kg, last menstrual period 01/18/2023, SpO2 98%.  Patient Vitals for the past 24 hrs:  BP Temp Temp src Pulse Resp SpO2 Height Weight  08/21/23 1420 124/81 -- -- (!) 114 -- 98 % -- --  08/21/23 1243 129/80 97.7 F (36.5 C) Oral (!) 119 18 98 % -- --  08/21/23 1238 -- -- -- -- -- -- 5\' 2"  (1.575 m) 118.4 kg   Orthostatic VS for the past 24 hrs (Last 3 readings):  BP- Lying Pulse- Lying BP- Sitting Pulse- Sitting BP- Standing at 0 minutes Pulse- Standing at 0 minutes BP- Standing at 3 minutes Pulse- Standing at 3 minutes  08/21/23 1408 125/72 100 126/82 109 121/65 125 124/81 108    Physical Exam Vitals and nursing note reviewed.  Constitutional:      General: She is not in acute distress.    Appearance: She is well-developed.     Comments: Pregnant female  HENT:     Head: Normocephalic and atraumatic.  Eyes:     General: No scleral icterus.    Conjunctiva/sclera: Conjunctivae normal.  Cardiovascular:     Rate and Rhythm: Normal rate.  Pulmonary:     Effort: Pulmonary effort is normal.  Chest:     Chest wall: No tenderness.  Abdominal:     Palpations: Abdomen is soft.     Tenderness: There is no abdominal tenderness. There is no guarding or rebound.     Comments: Gravid  Genitourinary:    Vagina: Normal.  Musculoskeletal:        General: Normal range of motion.     Cervical back: Normal range of motion and neck supple.  Skin:    General: Skin is warm and dry.     Findings: No rash.  Neurological:     Mental Status: She is alert and oriented to person, place, and  time.     MAU Course  Procedures  I reviewed the patient's fetal monitoring.  Baseline HR: 140 Variability:  moderate Accels:present Decels: none  A/P: Reactive NST  Reassured regarding fetal status.   MDM: moderate  This patient presents to the ED for concern of   Chief Complaint  Patient presents with   Increased Pulse   Light headed     These complains involves an extensive number of treatment options, and is a complaint that carries with it a high risk of complications and morbidity.  The differential diagnosis for  1.  Dizziness, lightheadedness INCLUDES [place] etiology, low blood sugar, orthostatic hypotension, normal variant due to  compression of the vena cava     Co morbidities that complicate the patient evaluation:  Additional history obtained from mother  Interpreter services used: not applicable  External records from outside source obtained and reviewed including Prenatal care records  Lab Tests:   I ordered, and personally interpreted labs.  The pertinent results include:   Results for orders placed or performed during the hospital encounter of 08/21/23 (from the past 24 hours)  Glucose, capillary     Status: Abnormal   Collection Time: 08/21/23 12:54 PM  Result Value Ref Range   Glucose-Capillary 141 (H) 70 - 99 mg/dL  CBC     Status: Abnormal   Collection Time: 08/21/23  1:22 PM  Result Value Ref Range   WBC 15.3 (H) 4.0 - 10.5 K/uL   RBC 3.92 3.87 - 5.11 MIL/uL   Hemoglobin 11.4 (L) 12.0 - 15.0 g/dL   HCT  34.5 (L) 36.0 - 46.0 %   MCV 88.0 80.0 - 100.0 fL   MCH 29.1 26.0 - 34.0 pg   MCHC 33.0 30.0 - 36.0 g/dL   RDW 91.4 78.2 - 95.6 %   Platelets 414 (H) 150 - 400 K/uL   nRBC 0.0 0.0 - 0.2 %  Comprehensive metabolic panel     Status: Abnormal   Collection Time: 08/21/23  1:22 PM  Result Value Ref Range   Sodium 137 135 - 145 mmol/L   Potassium 3.8 3.5 - 5.1 mmol/L   Chloride 102 98 - 111 mmol/L   CO2 22 22 - 32 mmol/L   Glucose, Bld 135  (H) 70 - 99 mg/dL   BUN 8 6 - 20 mg/dL   Creatinine, Ser 2.13 0.44 - 1.00 mg/dL   Calcium 8.8 (L) 8.9 - 10.3 mg/dL   Total Protein 6.2 (L) 6.5 - 8.1 g/dL   Albumin 2.4 (L) 3.5 - 5.0 g/dL   AST 14 (L) 15 - 41 U/L   ALT 13 0 - 44 U/L   Alkaline Phosphatase 76 38 - 126 U/L   Total Bilirubin 0.4 <1.2 mg/dL   GFR, Estimated >08 >65 mL/min   Anion gap 13 5 - 15  Brain natriuretic peptide     Status: None   Collection Time: 08/21/23  1:36 PM  Result Value Ref Range   B Natriuretic Peptide 11.0 0.0 - 100.0 pg/mL  Troponin I (High Sensitivity)     Status: None   Collection Time: 08/21/23  1:36 PM  Result Value Ref Range   Troponin I (High Sensitivity) 8 <18 ng/L     Cardiac Testing/Monitoring:  EKG was ordered today. I personally reviewed or consulted with a physician to help with intrepretation.    Medicines ordered and prescription drug management:  Medications: No orders of the defined types were placed in this encounter.     Reevaluation of the patient after these medicines showed that the patient improved I have reviewed the patients home medicines and have made adjustments as needed    MAU Course: 5:49 PM Reviewed plan for discharge. Lengthy discussion about lifestyle modifications with increased fluids, increased electrolytes and more frequent high protein snacks    After the interventions noted above, I reevaluated the patient and found that they have :improved  Dispostion: discharged   Assessment and Plan   1. Dizziness   2. Encounter for supervision of normal pregnancy, antepartum, unspecified gravidity   3. NST (non-stress test) reactive   4. Lightheadedness   5. [redacted] weeks gestation of pregnancy   -Workup was negative today for any cardiac etiology of her dizziness and presyncopal -Ruled out orthostatic hypotension with normal orthostatic vital signs here in the MAU -Recommended increasing electrolytes especially in the morning -Reviewed high-protein snacks  throughout the day and the goal of increased protein in her diet in general. -Recommended follow-up with [facility] cardiology -Patient will attend her scheduled prenatal visit  Future Appointments  Date Time Provider Department Center  08/30/2023  7:30 AM [facility] US1 [facility]  09/04/2023 10:00 AM [facility] NURSE [facility]  09/04/2023 10:30 AM Rasch, Harolyn Rutherford, NP [facility]  09/07/2023  8:30 AM [facility] NURSE [facility]  09/14/2023  3:30 PM [facility] US3 [facility]  09/18/2023 10:30 AM Rasch, Harolyn Rutherford, NP [facility]  09/21/2023 11:30 AM [facility] US4 [facility]  09/28/2023 11:30 AM [facility] US4 [facility]  10/02/2023 10:30 AM Rasch, Harolyn Rutherford, NP [facility]  Isa Rankin [facility] 08/21/2023, 5:31 PM

## 2023-08-21 NOTE — MAU Note (Signed)
Angela Blackwell is a 27 y.o. at [redacted]w[redacted]d here in MAU reporting: sent from [facility] office secondary light headedness, profuse sweating, and increased pulse.  States was diagnosed with GDM today during office visit, will be taking Metformin.  Denies VB or LOF.  Reports +FM. LMP: NA Onset of complaint: ongoing Pain score: 0 Vitals:   08/21/23 1243  BP: 129/80  Pulse: (!) 119  Resp: 18  Temp: 97.7 F (36.5 C)  SpO2: 98%     FHT:153 bpm Lab orders placed from triage:   None

## 2023-08-21 NOTE — Progress Notes (Signed)
PRENATAL VISIT NOTE  Subjective:  Angela Blackwell is a 27 y.o. G3P0020 at [redacted]w[redacted]d being seen today for ongoing prenatal care.  She is currently monitored for the following issues for this low-risk pregnancy and has Vitamin D deficiency; Supervision of normal pregnancy; BMI 38.0-38.9,adult; Obesity in pregnancy; and FOB with hereditary spherocytosis on their problem list.  Patient reports syncope at home; Multiple times, passed out 3 times in the last 2 weeks. Hasn't been to work in 6 weeks due to feelings or dizziness, lightheadedness. Had full cardiac workup with Dr. Servando Salina in 05/2023; all results normal. Plans to see Dr. Servando Salina closer to delivery.   Contractions: Not present. Vag. Bleeding: None.  Movement: Present. Denies leaking of fluid.   The following portions of the patient's history were reviewed and updated as appropriate: allergies, current medications, past family history, past medical history, past social history, past surgical history and problem list.   Objective:   Vitals:   08/21/23 1029  BP: 118/79  Pulse: (!) 120  Temp: 98.3 F (36.8 C)  Weight: 258 lb (117 kg)    Fetal Status: Fetal Heart Rate (bpm): 161   Movement: Present     General:  Alert, oriented and cooperative. Patient is in no acute distress.  Skin: Skin is warm and dry. No rash noted.   Cardiovascular: Normal heart rate noted  Respiratory: Normal respiratory effort, no problems with respiration noted  Abdomen: Soft, gravid, appropriate for gestational age.  Pain/Pressure: Present     Pelvic: Cervical exam deferred        Extremities: Normal range of motion.  Edema: Trace  Mental Status: Normal mood and affect. Normal behavior. Normal judgment and thought content.   Assessment and Plan:  Pregnancy: G3P0020 at [redacted]w[redacted]d  1. Syncope, unspecified syncope type   2. Gestational diabetes mellitus (GDM) in third trimester controlled on oral hypoglycemic drug   3. Tachycardia     -2 hour GTT passed but borderline  numbers and polyhydramnio's -New GDM diagnoses today based on complete BS log provided. Log scanned into Records  -Majority of fasting BS <95, few outliers -Majority of 2 hour PP BS >120 -Rx Metformin to take daily AM 500 mg.  -Continue to log BS fasting, and 2 hour post all meals -Increase oral fluids to 100-120 ounces per day. - 3 meals and 3 snacks daily with protein snack prior to bedtime.  - Needs antenatal testing at 32 weeks - Continue MFM dopplers and [place].   -Hatley is present in the office today with her mom.  -Manual HR 120's while sitting in the office -Orthostatic vitals completed with HR in the 130's-140's standing, then back down to 120's upon completion.  -Significant sweating noted during orthostatic vitals; beads of sweat on face and clothing wet.  -Scheduled with Dr. Servando Salina 1/7, patient to call the office to schedule sooner appointment if available.  - Discussed Patient with Shay CNM in MAU. Patient is on her way there.   Preterm labor symptoms and general obstetric precautions including but not limited to vaginal bleeding, contractions, leaking of fluid and fetal movement were reviewed in detail with the patient. Please refer to After Visit Summary for other counseling recommendations.   Return Can confirm weekly antenatal testing at 32 weeks..  Future Appointments  Date Time Provider Department Center  08/30/2023  7:30 AM [facility] US1 [facility]  09/04/2023 10:00 AM [facility] NURSE [facility]  09/04/2023 10:30 AM Muskaan Smet, Harolyn Rutherford, NP [facility]  09/07/2023  8:30 AM  [facility] NURSE [facility]  09/14/2023  3:30 PM [facility] US3 [facility]  09/18/2023 10:30 AM Khing Belcher, Harolyn Rutherford, NP [facility]  09/21/2023 11:30 AM [facility] US4 [facility]  09/28/2023 11:30 AM [facility] US4 [facility]  10/02/2023 10:30 AM Jaeleigh Monaco, Harolyn Rutherford, NP [facility]    Venia Carbon, NP

## 2023-08-21 NOTE — Discharge Instructions (Signed)
You were seen at the maternity assessment unit for dizziness and lightheadedness.  We discussed how most of your symptoms start in the morning and resolved by 5 PM.  While you were here we did electrolyte testing that showed that your electrolytes were normal we also tested for any heart strain and also that your heart was functioning well and it was.  We also did an EKG that showed that there is normal electrical function of your heart.  We also performed orthostatic blood pressures which showed that your heart rate and your blood pressure did not drop when you were lying back sitting up or standing.   You were here in the MAU your heart rate remained between 90 and 115 for most of the time that you were here.  This is overall very reassuring.  We discussed some lifestyle modifications that may help you with your symptoms.  -Consider having a snack at 2 to 3 AM when you wake up assuring that is a high-protein snack -Increase protein in your diet with the goal being 100 g daily -Increase your electrolyte intake and consider drinking to Gatorade zeros in the morning -We also discussed high-protein snacks throughout the day and the goal of eating every 2-3 hours  Overall your workup here in the MAU was reassuring.  That does not mean that we know exactly what is called in seeing your symptoms.  We recommend that you follow-up with the [place] cardiologist  Please return to the maternity assessment unit if you have continued dizziness, lightheadedness, passing out or any other concern

## 2023-08-30 ENCOUNTER — Other Ambulatory Visit: Payer: Self-pay | Admitting: Obstetrics

## 2023-08-30 ENCOUNTER — Ambulatory Visit: Payer: Medicaid Other | Attending: Obstetrics

## 2023-08-30 ENCOUNTER — Ambulatory Visit: Payer: Medicaid Other | Attending: Maternal & Fetal Medicine | Admitting: Maternal & Fetal Medicine

## 2023-08-30 ENCOUNTER — Other Ambulatory Visit: Payer: Self-pay

## 2023-08-30 ENCOUNTER — Other Ambulatory Visit: Payer: Self-pay | Admitting: *Deleted

## 2023-08-30 DIAGNOSIS — O3663X0 Maternal care for excessive fetal growth, third trimester, not applicable or unspecified: Secondary | ICD-10-CM | POA: Diagnosis not present

## 2023-08-30 DIAGNOSIS — Z3A32 32 weeks gestation of pregnancy: Secondary | ICD-10-CM

## 2023-08-30 DIAGNOSIS — O3660X0 Maternal care for excessive fetal growth, unspecified trimester, not applicable or unspecified: Secondary | ICD-10-CM | POA: Insufficient documentation

## 2023-08-30 DIAGNOSIS — O24415 Gestational diabetes mellitus in pregnancy, controlled by oral hypoglycemic drugs: Secondary | ICD-10-CM | POA: Diagnosis not present

## 2023-08-30 DIAGNOSIS — D580 Hereditary spherocytosis: Secondary | ICD-10-CM

## 2023-08-30 DIAGNOSIS — O403XX0 Polyhydramnios, third trimester, not applicable or unspecified: Secondary | ICD-10-CM

## 2023-08-30 DIAGNOSIS — O3519X0 Maternal care for (suspected) chromosomal abnormality in fetus, other chromosomal abnormality, not applicable or unspecified: Secondary | ICD-10-CM | POA: Diagnosis not present

## 2023-08-30 DIAGNOSIS — O99213 Obesity complicating pregnancy, third trimester: Secondary | ICD-10-CM

## 2023-08-30 DIAGNOSIS — Z8489 Family history of other specified conditions: Secondary | ICD-10-CM

## 2023-08-30 DIAGNOSIS — E669 Obesity, unspecified: Secondary | ICD-10-CM

## 2023-08-30 NOTE — Progress Notes (Signed)
   Patient information  Patient Name: Angela Blackwell  Patient MRN:   [id number]  Referring practice: MFM Referring Provider: [place] [facility] [place]  MFM CONSULT  Angela Blackwell is a 27 y.o. G3P0020 at [redacted]w[redacted]d here for ultrasound and consultation. Patient Active Problem List   Diagnosis Date Noted   Gestational diabetes mellitus (GDM) controlled on oral hypoglycemic drug, antepartum 08/30/2023   Large for gestational age fetus affecting management of mother, antepartum 08/30/2023   Polyhydramnios affecting pregnancy in third trimester 08/30/2023   FOB with hereditary spherocytosis 05/23/2023   Obesity in pregnancy 05/22/2023   BMI 38.0-38.9,adult 04/25/2023   Supervision of normal pregnancy 04/24/2023   Vitamin D deficiency 10/31/2021   RE GDM: Overall her blood sugars are reasonably well-controlled.  She has noticed that her diet has had little impact on her blood sugar values.  I discussed the findings of large for gestational age fetus as well as polyhydramnios today.  I discussed that these are signs of poor glycemic control and that she should attempt to optimize every single blood sugar value through diet and medication compliance.  She is compliant with metformin.  I discussed the importance of continuing to check her blood sugars and record them to present them to her OB provider.  Also discussed that insulin may be necessary to achieve ideal glycemic control.  I discussed the potential for fetal hypoxia/stillbirth and potential need for early delivery around 37 to 38 weeks.  RE amniocentesis confirmed hereditary spherocytosis: Currently no signs of fetal anemia on MCA Doppler.  Will continue every 1 to 2 weeks.  Will also continue to assess for fetal hydrops.  Sonographic findings Single intrauterine pregnancy. Fetal cardiac activity: Observed. Presentation: Cephalic. Interval fetal anatomy appears normal. Fetal biometry shows the estimated fetal weight at the 97 percentile and the  abdominal circumference at the 99 percentile.  Amniotic fluid volume: Polyhydramnios. AFI: 27.25 cm.  MVP: 8.87 cm. Placenta: Posterior Fundal. BPP: 8/8. MCA 1.28.   Recommendations 1. Weekly antenatal testing (BPP) and MCA dopplers every 1-2 weeks to assess for fetal anemia.  2. Growth ultrasounds every 4weeks until delivery  3. Delivery around 37-[redacted] weeks gestation    Review of Systems: A review of systems was performed and was negative except per HPI   Vitals and Physical Exam    08/30/2023    7:32 AM 08/21/2023    2:20 PM 08/21/2023   12:43 PM  Vitals with BMI  Systolic 121 124 409  Diastolic 86 81 80  Pulse  114 811   Sitting comfortably on the sonogram table Nonlabored breathing Normal rate and rhythm Abdomen is nontender  Past pregnancies OB History  Gravida Para Term Preterm AB Living  3    2   SAB IAB Ectopic Multiple Live Births  2        # Outcome Date GA Lbr Len/2nd Weight Sex Type Anes PTL Lv  3 Current           2 SAB           1 SAB              I spent 20 minutes reviewing the patients chart, including labs and images as well as counseling the patient about her medical conditions. Greater than 50% of the time was spent in direct face-to-face patient counseling.  Braxton Feathers  MFM, [facility]   08/30/2023  8:29 AM

## 2023-09-04 ENCOUNTER — Other Ambulatory Visit: Payer: No Typology Code available for payment source

## 2023-09-04 ENCOUNTER — Ambulatory Visit (INDEPENDENT_AMBULATORY_CARE_PROVIDER_SITE_OTHER): Payer: Medicaid Other | Admitting: Obstetrics and Gynecology

## 2023-09-04 VITALS — BP 118/81 | HR 87 | Wt 262.0 lb

## 2023-09-04 DIAGNOSIS — O3660X0 Maternal care for excessive fetal growth, unspecified trimester, not applicable or unspecified: Secondary | ICD-10-CM

## 2023-09-04 DIAGNOSIS — Z3490 Encounter for supervision of normal pregnancy, unspecified, unspecified trimester: Secondary | ICD-10-CM

## 2023-09-04 DIAGNOSIS — O24415 Gestational diabetes mellitus in pregnancy, controlled by oral hypoglycemic drugs: Secondary | ICD-10-CM

## 2023-09-04 MED ORDER — METFORMIN HCL 500 MG PO TABS: 1000.0000 mg | ORAL_TABLET | Freq: Every day | ORAL | 0 refills | Status: DC

## 2023-09-04 NOTE — Progress Notes (Signed)
   PRENATAL VISIT NOTE  Subjective:  Angela Blackwell is a 27 y.o. G3P0020 at [redacted]w[redacted]d being seen today for ongoing prenatal care.  She is currently monitored for the following issues for this low-risk pregnancy and has Vitamin D  deficiency; Supervision of normal pregnancy; BMI 38.0-38.9,adult; Obesity in pregnancy; FOB with hereditary spherocytosis; Gestational diabetes mellitus (GDM) controlled on oral hypoglycemic drug, antepartum; Large for gestational age fetus affecting management of mother, antepartum; and Polyhydramnios affecting pregnancy in third trimester on their problem list.  Patient reports no complaints.  Contractions: Irritability. Vag. Bleeding: None.  Movement: Present. Denies leaking of fluid.   The following portions of the patient's history were reviewed and updated as appropriate: allergies, current medications, past family history, past medical history, past social history, past surgical history and problem list.   Objective:   Vitals:   09/04/23 1030  BP: 118/81  Pulse: 87  Weight: 262 lb (118.8 kg)    Fetal Status: Fetal Heart Rate (bpm): 153   Movement: Present     General:  Alert, oriented and cooperative. Patient is in no acute distress.  Skin: Skin is warm and dry. No rash noted.   Cardiovascular: Normal heart rate noted  Respiratory: Normal respiratory effort, no problems with respiration noted  Abdomen: Soft, gravid, appropriate for gestational age.  Pain/Pressure: Absent     Pelvic: Cervical exam deferred        Extremities: Normal range of motion.  Edema: None  Mental Status: Normal mood and affect. Normal behavior. Normal judgment and thought content.   Assessment and Plan:  Pregnancy: G3P0020 at 109w5d  1. Encounter for supervision of normal pregnancy, antepartum, unspecified gravidity (Primary)  Feeling much better.  Less dizzy spells.  Has altered her diet and is eating more protein with meals.  2. Excessive fetal growth affecting management of  pregnancy, antepartum, single or unspecified fetus  Continue MFM US  and Q4 week growth US  Discussed delivery at or around 37 weeks or sooner if BS become uncontrolled or abnormal dopplers.   3. Gestational diabetes mellitus (GDM) controlled on oral hypoglycemic drug, antepartum  Complete BS log provided today.  6/7 fasting BS <95 Majority of 2 hour PP within normal <120 with some outliers. (144, 121, 121, 121) Currently taking Metformin  500 mg QAM, will increase to 1000 mg QAM She will send her BS log through  mychart in one week. If 2 hour PP continue to rise will initiate Lantus Q AM.    Preterm labor symptoms and general obstetric precautions including but not limited to vaginal bleeding, contractions, leaking of fluid and fetal movement were reviewed in detail with the patient. Please refer to After Visit Summary for other counseling recommendations.   No follow-ups on file.  Future Appointments  Date Time Provider Department Center  09/07/2023 11:30 AM [facility] US4 [facility]  09/14/2023  3:30 PM [facility] US3 [facility]  09/18/2023 10:30 AM William Schake, Delon FERNS, NP [facility]  09/21/2023 11:30 AM [facility] US4 [facility]  09/28/2023 11:30 AM [facility] US4 [facility]  10/02/2023 10:30 AM Zayvier Caravello, Delon FERNS, NP [facility]    Delon Emms, NP

## 2023-09-05 NOTE — L&D Delivery Note (Cosign Needed Addendum)
Operative Delivery Note At 12:00 PM a viable and healthy female was delivered via Vaginal, Spontaneous.  Presentation: vertex; Position: Right,, Occiput,, Anterior;   Delivery of the head:  First maneuver: 10/05/2023 11:59 AM, [place] Second maneuver: 10/05/2023 12:00 PM, Suprapubic Pressure  CNM went in to grasp posterior arm but infant rotated and delivered without the need to deliver posterior arm.   Verbal consent: obtained from patient.  APGAR: 7, 9; weight 7 lb 14.3 oz (3580 g).   Placenta status: spontaneous, intact.   Cord:  with the following complications: .  Cord pH: 7.13  Anesthesia:   Episiotomy: None Lacerations: Labial Suture Repair:  3.0 monocryl Est. Blood Loss (mL): 105  Mom to postpartum.  Baby to Couplet care / Skin to Skin.  Sharen Counter 10/05/2023, 4:46 PM

## 2023-09-07 ENCOUNTER — Ambulatory Visit: Payer: 59 | Attending: Maternal & Fetal Medicine

## 2023-09-07 ENCOUNTER — Other Ambulatory Visit: Payer: No Typology Code available for payment source

## 2023-09-07 DIAGNOSIS — O99213 Obesity complicating pregnancy, third trimester: Secondary | ICD-10-CM | POA: Diagnosis not present

## 2023-09-07 DIAGNOSIS — E669 Obesity, unspecified: Secondary | ICD-10-CM

## 2023-09-07 DIAGNOSIS — Z3A33 33 weeks gestation of pregnancy: Secondary | ICD-10-CM

## 2023-09-07 DIAGNOSIS — O3510X0 Maternal care for (suspected) chromosomal abnormality in fetus, unspecified, not applicable or unspecified: Secondary | ICD-10-CM | POA: Diagnosis not present

## 2023-09-07 DIAGNOSIS — O3663X0 Maternal care for excessive fetal growth, third trimester, not applicable or unspecified: Secondary | ICD-10-CM | POA: Diagnosis not present

## 2023-09-07 DIAGNOSIS — O403XX0 Polyhydramnios, third trimester, not applicable or unspecified: Secondary | ICD-10-CM | POA: Diagnosis not present

## 2023-09-07 DIAGNOSIS — O24415 Gestational diabetes mellitus in pregnancy, controlled by oral hypoglycemic drugs: Secondary | ICD-10-CM

## 2023-09-11 ENCOUNTER — Ambulatory Visit: Payer: No Typology Code available for payment source | Admitting: Cardiology

## 2023-09-14 ENCOUNTER — Ambulatory Visit: Payer: No Typology Code available for payment source | Attending: Obstetrics

## 2023-09-14 ENCOUNTER — Ambulatory Visit: Payer: Medicaid Other

## 2023-09-14 ENCOUNTER — Other Ambulatory Visit: Payer: Self-pay

## 2023-09-14 DIAGNOSIS — O3510X0 Maternal care for (suspected) chromosomal abnormality in fetus, unspecified, not applicable or unspecified: Secondary | ICD-10-CM | POA: Diagnosis not present

## 2023-09-14 DIAGNOSIS — D580 Hereditary spherocytosis: Secondary | ICD-10-CM | POA: Insufficient documentation

## 2023-09-14 DIAGNOSIS — Z3A34 34 weeks gestation of pregnancy: Secondary | ICD-10-CM

## 2023-09-14 DIAGNOSIS — O24415 Gestational diabetes mellitus in pregnancy, controlled by oral hypoglycemic drugs: Secondary | ICD-10-CM

## 2023-09-14 DIAGNOSIS — E669 Obesity, unspecified: Secondary | ICD-10-CM

## 2023-09-14 DIAGNOSIS — O403XX0 Polyhydramnios, third trimester, not applicable or unspecified: Secondary | ICD-10-CM

## 2023-09-14 DIAGNOSIS — O3663X0 Maternal care for excessive fetal growth, third trimester, not applicable or unspecified: Secondary | ICD-10-CM | POA: Diagnosis not present

## 2023-09-14 DIAGNOSIS — O99213 Obesity complicating pregnancy, third trimester: Secondary | ICD-10-CM | POA: Diagnosis not present

## 2023-09-18 ENCOUNTER — Ambulatory Visit (INDEPENDENT_AMBULATORY_CARE_PROVIDER_SITE_OTHER): Payer: Medicaid Other | Admitting: Obstetrics and Gynecology

## 2023-09-18 VITALS — BP 129/84 | HR 106 | Wt 265.0 lb

## 2023-09-18 DIAGNOSIS — Z3490 Encounter for supervision of normal pregnancy, unspecified, unspecified trimester: Secondary | ICD-10-CM

## 2023-09-18 DIAGNOSIS — Z3A35 35 weeks gestation of pregnancy: Secondary | ICD-10-CM

## 2023-09-18 DIAGNOSIS — O24415 Gestational diabetes mellitus in pregnancy, controlled by oral hypoglycemic drugs: Secondary | ICD-10-CM

## 2023-09-18 NOTE — Progress Notes (Signed)
   PRENATAL VISIT NOTE  Subjective:  Angela Blackwell is a 28 y.o. G3P0020 at [redacted]w[redacted]d being seen today for ongoing prenatal care.  She is currently monitored for the following issues for this high-risk pregnancy and has Vitamin D  deficiency; Supervision of normal pregnancy; BMI 38.0-38.9,adult; Obesity in pregnancy; FOB with hereditary spherocytosis; Gestational diabetes mellitus (GDM) controlled on oral hypoglycemic drug, antepartum; Large for gestational age fetus affecting management of mother, antepartum; and Polyhydramnios affecting pregnancy in third trimester on their problem list.  Patient reports no complaints and occasional contractions .  Contractions: Irritability. Vag. Bleeding: None.  Movement: Present. Denies leaking of fluid.   The following portions of the patient's history were reviewed and updated as appropriate: allergies, current medications, past family history, past medical history, past social history, past surgical history and problem list.   Objective:   Vitals:   09/18/23 1033  BP: 129/84  Pulse: (!) 106  Weight: 265 lb (120.2 kg)    Fetal Status: Fetal Heart Rate (bpm): 159   Movement: Present     General:  Alert, oriented and cooperative. Patient is in no acute distress.  Skin: Skin is warm and dry. No rash noted.   Cardiovascular: Normal heart rate noted  Respiratory: Normal respiratory effort, no problems with respiration noted  Abdomen: Soft, gravid, appropriate for gestational age.  Pain/Pressure: Absent     Pelvic: Cervical exam deferred        Extremities: Normal range of motion.  Edema: Trace  Mental Status: Normal mood and affect. Normal behavior. Normal judgment and thought content.   Assessment and Plan:  Pregnancy: G3P0020 at [redacted]w[redacted]d  1. Gestational diabetes mellitus (GDM) controlled on oral hypoglycemic drug, antepartum (Primary)  - Complete BS log provided today. - all fasting BS within normal range <95 - Majority of 2 hour PP normal with a few  rare outliers (120, 121, 123) - Doing well on Metformin  1000 mg PO AM, Will consider insulin if BS begin to rise. She is reliable and has been sending her BS logs through MyChart.  - Will discuss delivery plan once growth US  is completed with MFM, would consider 39 week induction.  - Discussed the importance of Postpartum BS testing   2. Encounter for supervision of normal pregnancy, antepartum, unspecified gravidity  Doing well Very little dizziness since BS have become controlled Plans to see Cards PP  Reports good fetal movement.   Preterm labor symptoms and general obstetric precautions including but not limited to vaginal bleeding, contractions, leaking of fluid and fetal movement were reviewed in detail with the patient. Please refer to After Visit Summary for other counseling recommendations.   No follow-ups on file.  Future Appointments  Date Time Provider Department Center  09/21/2023 11:30 AM [facility] US4 [facility]  09/28/2023 11:30 AM [facility] US4 [facility]  10/02/2023 10:30 AM Modell Fendrick, Delon FERNS, NP [facility]    Delon Emms, NP

## 2023-09-21 ENCOUNTER — Ambulatory Visit: Payer: No Typology Code available for payment source | Attending: Obstetrics

## 2023-09-21 ENCOUNTER — Other Ambulatory Visit: Payer: Self-pay | Admitting: *Deleted

## 2023-09-21 ENCOUNTER — Ambulatory Visit: Payer: Medicaid Other | Attending: Maternal & Fetal Medicine | Admitting: Maternal & Fetal Medicine

## 2023-09-21 DIAGNOSIS — D580 Hereditary spherocytosis: Secondary | ICD-10-CM | POA: Diagnosis present

## 2023-09-21 DIAGNOSIS — Z3400 Encounter for supervision of normal first pregnancy, unspecified trimester: Secondary | ICD-10-CM

## 2023-09-21 DIAGNOSIS — O24415 Gestational diabetes mellitus in pregnancy, controlled by oral hypoglycemic drugs: Secondary | ICD-10-CM

## 2023-09-21 DIAGNOSIS — O403XX0 Polyhydramnios, third trimester, not applicable or unspecified: Secondary | ICD-10-CM | POA: Diagnosis not present

## 2023-09-21 DIAGNOSIS — O352XX0 Maternal care for (suspected) hereditary disease in fetus, not applicable or unspecified: Secondary | ICD-10-CM | POA: Diagnosis not present

## 2023-09-21 DIAGNOSIS — O3660X0 Maternal care for excessive fetal growth, unspecified trimester, not applicable or unspecified: Secondary | ICD-10-CM

## 2023-09-21 DIAGNOSIS — O3510X0 Maternal care for (suspected) chromosomal abnormality in fetus, unspecified, not applicable or unspecified: Secondary | ICD-10-CM | POA: Diagnosis not present

## 2023-09-21 DIAGNOSIS — O3663X0 Maternal care for excessive fetal growth, third trimester, not applicable or unspecified: Secondary | ICD-10-CM | POA: Diagnosis not present

## 2023-09-21 DIAGNOSIS — Z3A35 35 weeks gestation of pregnancy: Secondary | ICD-10-CM

## 2023-09-21 DIAGNOSIS — E669 Obesity, unspecified: Secondary | ICD-10-CM | POA: Diagnosis not present

## 2023-09-21 DIAGNOSIS — O99213 Obesity complicating pregnancy, third trimester: Secondary | ICD-10-CM | POA: Diagnosis not present

## 2023-09-21 NOTE — Progress Notes (Signed)
Patient information  Patient Name: Angela Blackwell  Patient MRN:   [id number]  Referring practice: MFM Referring Provider: [place] [facility] [place]  MFM CONSULT  Angela Blackwell is a 28 y.o. G3P0020 at 104w1d here for ultrasound and consultation. Patient Active Problem List   Diagnosis Date Noted   Gestational diabetes mellitus (GDM) controlled on oral hypoglycemic drug, antepartum 08/30/2023   Large for gestational age fetus affecting management of mother, antepartum 08/30/2023   Polyhydramnios affecting pregnancy in third trimester 08/30/2023   FOB with hereditary spherocytosis 05/23/2023   Obesity in pregnancy 05/22/2023   BMI 38.0-38.9,adult 04/25/2023   Supervision of normal pregnancy 04/24/2023   Vitamin D deficiency 10/31/2021    Angela Blackwell is doing well today with no acute concerns. She denies contractions, bleeding, or loss of fluid and reports good fetal movement.   RE GMDA1: Patient reports that her blood sugars are well-controlled.  Her fasting blood sugars range from 84-92.  Her 2-hour postprandial blood sugars range from 112-121.  She is compliant with metformin 1000 mg daily.  On her previous ultrasound the estimated fetal weight was in the large for gestational age category and there was polyhydramnios which is now resolved.  Will reassess fetal growth next week and determine delivery timing based off the estimated fetal weight.  RE amniocentesis confirmed hereditary spherocytosis: MCA Dopplers were normal.  There is no signs of fetal anemia on today's ultrasound.  The NICU will be notified after delivery to monitor blood count.  Sonographic findings Single intrauterine pregnancy. Fetal cardiac activity: Observed. Presentation: Cephalic. Interval fetal anatomy appears normal. Amniotic fluid: Within normal limits.  MVP: 7.7 cm. Placenta: Posterior Fundal. BPP: 8/8.  MCA dopplers were normal today.  There are limitations of prenatal ultrasound such as the inability to  detect certain abnormalities due to poor visualization. Various factors such as fetal position, gestational age and maternal body habitus may increase the difficulty in visualizing the fetal anatomy.    Recommendations -Continue weekly antenatal testing and MCA Dopplers due to amniocentesis confirmed hereditary spherocytosis and gestational diabetes with suspected fetal macrosomia -Delivery timing likely around 38 weeks if the next ultrasound shows fetal macrosomia.  If the estimated fetal weight is normal then she can go to 39 weeks as long as antenatal testing is reassuring.  Review of Systems: A review of systems was performed and was negative except per HPI   Vitals and Physical Exam    09/21/2023   11:28 AM 09/18/2023   10:33 AM 09/14/2023   12:32 PM  Vitals with BMI  Weight  265 lbs   BMI  48.46   Systolic 131 129 782  Diastolic 86 84 71  Pulse 99 106 103    Sitting comfortably on the sonogram table Nonlabored breathing Normal rate and rhythm Abdomen is nontender  Past pregnancies OB History  Gravida Para Term Preterm AB Living  3    2   SAB IAB Ectopic Multiple Live Births  2        # Outcome Date GA Lbr Len/2nd Weight Sex Type Anes PTL Lv  3 Current           2 SAB           1 SAB             I spent 20 minutes reviewing the patients chart, including labs and images as well as counseling the patient about her medical conditions. Greater than 50% of the time was spent in direct  face-to-face patient counseling.  Braxton Feathers  MFM, [place]   09/21/2023  1:00 PM

## 2023-09-28 ENCOUNTER — Other Ambulatory Visit: Payer: Self-pay

## 2023-09-28 ENCOUNTER — Other Ambulatory Visit: Payer: Self-pay | Admitting: Obstetrics

## 2023-09-28 ENCOUNTER — Ambulatory Visit: Payer: Medicaid Other | Attending: Maternal & Fetal Medicine | Admitting: Maternal & Fetal Medicine

## 2023-09-28 ENCOUNTER — Ambulatory Visit: Payer: Medicaid Other | Attending: Obstetrics

## 2023-09-28 DIAGNOSIS — O3510X0 Maternal care for (suspected) chromosomal abnormality in fetus, unspecified, not applicable or unspecified: Secondary | ICD-10-CM | POA: Diagnosis not present

## 2023-09-28 DIAGNOSIS — O24415 Gestational diabetes mellitus in pregnancy, controlled by oral hypoglycemic drugs: Secondary | ICD-10-CM

## 2023-09-28 DIAGNOSIS — Z3A36 36 weeks gestation of pregnancy: Secondary | ICD-10-CM

## 2023-09-28 DIAGNOSIS — O3660X0 Maternal care for excessive fetal growth, unspecified trimester, not applicable or unspecified: Secondary | ICD-10-CM

## 2023-09-28 DIAGNOSIS — O403XX0 Polyhydramnios, third trimester, not applicable or unspecified: Secondary | ICD-10-CM

## 2023-09-28 DIAGNOSIS — D580 Hereditary spherocytosis: Secondary | ICD-10-CM | POA: Insufficient documentation

## 2023-09-28 DIAGNOSIS — O3663X0 Maternal care for excessive fetal growth, third trimester, not applicable or unspecified: Secondary | ICD-10-CM

## 2023-09-28 DIAGNOSIS — O99213 Obesity complicating pregnancy, third trimester: Secondary | ICD-10-CM

## 2023-09-28 DIAGNOSIS — E669 Obesity, unspecified: Secondary | ICD-10-CM

## 2023-09-28 NOTE — Progress Notes (Signed)
Patient information  Patient Name: Angela Blackwell  Patient MRN:   [id number]  Referring practice: MFM Referring Provider: [place] [facility] [place]  MFM CONSULT  Angela Blackwell is a 28 y.o. G3P0020 at [redacted]w[redacted]d here for ultrasound and consultation. Patient Active Problem List   Diagnosis Date Noted   Gestational diabetes mellitus (GDM) controlled on oral hypoglycemic drug, antepartum 08/30/2023   Large for gestational age fetus affecting management of mother, antepartum 08/30/2023   Polyhydramnios affecting pregnancy in third trimester 08/30/2023   FOB with hereditary spherocytosis 05/23/2023   Obesity in pregnancy 05/22/2023   BMI 38.0-38.9,adult 04/25/2023   Supervision of normal pregnancy 04/24/2023   Vitamin D deficiency 10/31/2021   Angela Blackwell is doing well today with no acute concerns. She denies contractions, bleeding, or loss of fluid and reports good fetal movement.   RE GMDA2: Angela Blackwell reports good blood sugars despite having a large for gestational age fetus.  I discussed the concern for shoulder dystocia and birth injury if her fetus was over 4500 g.  I discussed that that is the recommended cutoff for cesarean delivery.  In order to avoid achieving this fetal weight I recommend she deliver at the end of 37 weeks or early part of [redacted] weeks gestation.  This will maximize the chance of a successful vaginal delivery while minimizing the risk to the newborn and for extensive perineal laceration.  I also counseled about the newborn complications associate with diabetes including but not limited to the need for glucose monitoring due to hypoglycemic episodes, inability to control body temperature, difficulty with respirations and need for prolonged nursery or ICU stay.  Sonographic findings Single intrauterine pregnancy. Fetal cardiac activity: Observed. Presentation: Cephalic. Interval fetal anatomy appears normal. Fetal biometry shows the estimated fetal weight at the > 99 percentile  (4,098J) Amniotic fluid: Within normal limits.  MVP: 7.27 cm. Placenta: Posterior Fundal. BPP: 8/8.  MCA is 1.11 MoM.   There are limitations of prenatal ultrasound such as the inability to detect certain abnormalities due to poor visualization. Various factors such as fetal position, gestational age and maternal body habitus may increase the difficulty in visualizing the fetal anatomy.    Recommendations -Continue weekly antenatal testing. -Due to fetal macrosomia in the setting of diabetes recommend delivery at the end of 37 weeks or early part of [redacted] weeks gestation -Notify pediatrics team regarding the neonatal diagnosis of hereditary spherocytosis and the need for anemia monitoring  Review of Systems: A review of systems was performed and was negative except per HPI   Vitals and Physical Exam    09/28/2023   12:05 PM 09/28/2023   11:29 AM 09/21/2023   11:28 AM  Vitals with BMI  Systolic 123 141 191  Diastolic 74 88 86  Pulse 101 114 99    Sitting comfortably on the sonogram table Nonlabored breathing Normal rate and rhythm Abdomen is nontender  Past pregnancies OB History  Gravida Para Term Preterm AB Living  3    2   SAB IAB Ectopic Multiple Live Births  2        # Outcome Date GA Lbr Len/2nd Weight Sex Type Anes PTL Lv  3 Current           2 SAB           1 SAB             I spent 10 minutes reviewing the patients chart, including labs and images as well as counseling the patient  about her medical conditions. Greater than 50% of the time was spent in direct face-to-face patient counseling.  Braxton Feathers  MFM, [facility]   09/28/2023  12:37 PM

## 2023-10-02 ENCOUNTER — Other Ambulatory Visit (HOSPITAL_COMMUNITY)
Admission: RE | Admit: 2023-10-02 | Discharge: 2023-10-02 | Disposition: A | Discharge status: Home / Self Care | Payer: Medicaid Other | Source: Ambulatory Visit | Attending: Obstetrics and Gynecology | Admitting: Obstetrics and Gynecology

## 2023-10-02 ENCOUNTER — Ambulatory Visit (INDEPENDENT_AMBULATORY_CARE_PROVIDER_SITE_OTHER): Payer: Medicaid Other | Admitting: Obstetrics and Gynecology

## 2023-10-02 DIAGNOSIS — O24415 Gestational diabetes mellitus in pregnancy, controlled by oral hypoglycemic drugs: Secondary | ICD-10-CM

## 2023-10-02 DIAGNOSIS — Z3490 Encounter for supervision of normal pregnancy, unspecified, unspecified trimester: Secondary | ICD-10-CM | POA: Diagnosis present

## 2023-10-02 DIAGNOSIS — O3660X0 Maternal care for excessive fetal growth, unspecified trimester, not applicable or unspecified: Secondary | ICD-10-CM | POA: Diagnosis not present

## 2023-10-02 DIAGNOSIS — O163 Unspecified maternal hypertension, third trimester: Secondary | ICD-10-CM

## 2023-10-02 DIAGNOSIS — Z3A36 36 weeks gestation of pregnancy: Secondary | ICD-10-CM | POA: Diagnosis not present

## 2023-10-02 NOTE — Progress Notes (Signed)
   PRENATAL VISIT NOTE  Subjective:  Angela Blackwell is a 28 y.o. G3P0020 at [redacted]w[redacted]d being seen today for ongoing prenatal care.  She is currently monitored for the following issues for this High-risk pregnancy and has Vitamin D deficiency; Supervision of normal pregnancy; BMI 38.0-38.9,adult; Obesity in pregnancy; FOB with hereditary spherocytosis; Gestational diabetes mellitus (GDM) controlled on oral hypoglycemic drug, antepartum; Large for gestational age fetus affecting management of mother, antepartum; and Polyhydramnios affecting pregnancy in third trimester on their problem list.  Patient reports no complaints.  Contractions: Irritability. Vag. Bleeding: None.  Movement: Present. Denies leaking of fluid.   The following portions of the patient's history were reviewed and updated as appropriate: allergies, current medications, past family history, past medical history, past social history, past surgical history and problem list.   Objective:   Vitals:   10/02/23 1029 10/02/23 1107  BP: (!) 133/92 138/84  Pulse: (!) 103 (!) 108  Weight: 276 lb (125.2 kg)     Fetal Status: Fetal Heart Rate (bpm): 145   Movement: Present     General:  Alert, oriented and cooperative. Patient is in no acute distress.  Skin: Skin is warm and dry. No rash noted.   Cardiovascular: Normal heart rate noted  Respiratory: Normal respiratory effort, no problems with respiration noted  Abdomen: Soft, gravid, appropriate for gestational age.  Pain/Pressure: Absent     Pelvic: Cervical exam deferred        Extremities: Normal range of motion.  Edema: Trace  Mental Status: Normal mood and affect. Normal behavior. Normal judgment and thought content.   Assessment and Plan:  Pregnancy: G3P0020 at [redacted]w[redacted]d  1. Encounter for supervision of normal pregnancy, antepartum, unspecified gravidity (Primary)  - Culture, beta strep (group b only) - Cervicovaginal ancillary only( [place])  2. Gestational diabetes mellitus  (GDM) controlled on oral hypoglycemic drug, antepartum  - All blood sugars normal fasting BS <95 - Occasionally elevated 120's 2 hour PP. Majority of 2 hour PP BS normal <120 - continue metformin 1000 mg qAM.   3. Excessive fetal growth affecting management of pregnancy, antepartum, single or unspecified fetus  4. Elevated blood pressure affecting pregnancy in third trimester, antepartum  - Intake BP elevated, repeat borderline - Return in 24 hours for repeat BP check - Low threshold to recommend delivery at 37 weeks. Will wait for BP recheck and labs.  - Strict preeclampsia precautions.  - Comp Met (CMET) - Protein / creatinine ratio, urine - CBC with Differential/Platelet    Preterm labor symptoms and general obstetric precautions including but not limited to vaginal bleeding, contractions, leaking of fluid and fetal movement were reviewed in detail with the patient. Please refer to After Visit Summary for other counseling recommendations.   No follow-ups on file.  Future Appointments  Date Time Provider Department Center  10/03/2023  9:00 AM [facility] NURSE [facility]  10/08/2023 11:15 AM [facility] NURSE [facility]  10/08/2023 11:30 AM [facility] US2 [facility]  11/06/2023  1:10 PM Carthel Castille, Harolyn Rutherford, NP [facility]    Venia Carbon, NP

## 2023-10-03 ENCOUNTER — Ambulatory Visit: Payer: Medicaid Other

## 2023-10-03 ENCOUNTER — Other Ambulatory Visit (HOSPITAL_COMMUNITY): Payer: Self-pay | Admitting: Obstetrics and Gynecology

## 2023-10-03 DIAGNOSIS — Z0131 Encounter for examination of blood pressure with abnormal findings: Secondary | ICD-10-CM

## 2023-10-03 LAB — CBC WITH DIFFERENTIAL/PLATELET
Basophils Absolute: 0 10*3/uL (ref 0.0–0.2)
Basos: 0 %
EOS (ABSOLUTE): 0.1 10*3/uL (ref 0.0–0.4)
Eos: 1 %
Hematocrit: 33.9 % — ABNORMAL LOW (ref 34.0–46.6)
Hemoglobin: 10.7 g/dL — ABNORMAL LOW (ref 11.1–15.9)
Immature Grans (Abs): 0.1 10*3/uL (ref 0.0–0.1)
Immature Granulocytes: 1 %
Lymphocytes Absolute: 2 10*3/uL (ref 0.7–3.1)
Lymphs: 13 %
MCH: 25.8 pg — ABNORMAL LOW (ref 26.6–33.0)
MCHC: 31.6 g/dL (ref 31.5–35.7)
MCV: 82 fL (ref 79–97)
Monocytes Absolute: 0.7 10*3/uL (ref 0.1–0.9)
Monocytes: 5 %
Neutrophils Absolute: 13 10*3/uL — ABNORMAL HIGH (ref 1.4–7.0)
Neutrophils: 80 %
Platelets: 393 10*3/uL (ref 150–450)
RBC: 4.14 x10E6/uL (ref 3.77–5.28)
RDW: 13.5 % (ref 11.7–15.4)
WBC: 15.9 10*3/uL — ABNORMAL HIGH (ref 3.4–10.8)

## 2023-10-03 LAB — CERVICOVAGINAL ANCILLARY ONLY
Bacterial Vaginitis (gardnerella): NEGATIVE
Candida Glabrata: NEGATIVE
Candida Vaginitis: POSITIVE — AB
Comment: NEGATIVE
Comment: NEGATIVE
Comment: NEGATIVE

## 2023-10-03 NOTE — Progress Notes (Signed)
Pt here for BP check per Angela Carbon, NP. Pt denies headache and/or visual changes. First BP reading 149/95. Repeat BP 139/86. Pulse 110. Per Angela Carbon, NP pt will go in for induction tonight at midnight.

## 2023-10-04 ENCOUNTER — Inpatient Hospital Stay (HOSPITAL_COMMUNITY): Payer: Medicaid Other | Admitting: Anesthesiology

## 2023-10-04 ENCOUNTER — Inpatient Hospital Stay (HOSPITAL_COMMUNITY)
Admission: AD | Admit: 2023-10-04 | Discharge: 2023-10-07 | DRG: 807 | Disposition: A | Discharge status: Home / Self Care | Payer: Medicaid Other | Attending: Obstetrics & Gynecology | Admitting: Obstetrics & Gynecology

## 2023-10-04 ENCOUNTER — Other Ambulatory Visit: Payer: Self-pay

## 2023-10-04 ENCOUNTER — Inpatient Hospital Stay (HOSPITAL_COMMUNITY): Payer: Medicaid Other

## 2023-10-04 ENCOUNTER — Encounter (HOSPITAL_COMMUNITY): Payer: Self-pay | Admitting: Obstetrics and Gynecology

## 2023-10-04 DIAGNOSIS — O3663X0 Maternal care for excessive fetal growth, third trimester, not applicable or unspecified: Secondary | ICD-10-CM | POA: Diagnosis present

## 2023-10-04 DIAGNOSIS — Z886 Allergy status to analgesic agent status: Secondary | ICD-10-CM

## 2023-10-04 DIAGNOSIS — Z885 Allergy status to narcotic agent status: Secondary | ICD-10-CM

## 2023-10-04 DIAGNOSIS — Z79899 Other long term (current) drug therapy: Secondary | ICD-10-CM

## 2023-10-04 DIAGNOSIS — Z8249 Family history of ischemic heart disease and other diseases of the circulatory system: Secondary | ICD-10-CM | POA: Diagnosis not present

## 2023-10-04 DIAGNOSIS — D580 Hereditary spherocytosis: Secondary | ICD-10-CM | POA: Diagnosis present

## 2023-10-04 DIAGNOSIS — O99334 Smoking (tobacco) complicating childbirth: Secondary | ICD-10-CM | POA: Diagnosis present

## 2023-10-04 DIAGNOSIS — F17210 Nicotine dependence, cigarettes, uncomplicated: Secondary | ICD-10-CM | POA: Diagnosis present

## 2023-10-04 DIAGNOSIS — Z833 Family history of diabetes mellitus: Secondary | ICD-10-CM | POA: Diagnosis not present

## 2023-10-04 DIAGNOSIS — O9902 Anemia complicating childbirth: Secondary | ICD-10-CM | POA: Diagnosis present

## 2023-10-04 DIAGNOSIS — O24424 Gestational diabetes mellitus in childbirth, insulin controlled: Secondary | ICD-10-CM | POA: Diagnosis not present

## 2023-10-04 DIAGNOSIS — O9962 Diseases of the digestive system complicating childbirth: Secondary | ICD-10-CM | POA: Diagnosis present

## 2023-10-04 DIAGNOSIS — O99214 Obesity complicating childbirth: Secondary | ICD-10-CM | POA: Diagnosis present

## 2023-10-04 DIAGNOSIS — K219 Gastro-esophageal reflux disease without esophagitis: Secondary | ICD-10-CM | POA: Diagnosis present

## 2023-10-04 DIAGNOSIS — Z3A37 37 weeks gestation of pregnancy: Secondary | ICD-10-CM

## 2023-10-04 DIAGNOSIS — O134 Gestational [pregnancy-induced] hypertension without significant proteinuria, complicating childbirth: Secondary | ICD-10-CM | POA: Diagnosis present

## 2023-10-04 DIAGNOSIS — Z7982 Long term (current) use of aspirin: Secondary | ICD-10-CM | POA: Diagnosis not present

## 2023-10-04 DIAGNOSIS — O139 Gestational [pregnancy-induced] hypertension without significant proteinuria, unspecified trimester: Principal | ICD-10-CM | POA: Diagnosis present

## 2023-10-04 DIAGNOSIS — O24425 Gestational diabetes mellitus in childbirth, controlled by oral hypoglycemic drugs: Principal | ICD-10-CM | POA: Diagnosis present

## 2023-10-04 DIAGNOSIS — O3660X0 Maternal care for excessive fetal growth, unspecified trimester, not applicable or unspecified: Secondary | ICD-10-CM | POA: Diagnosis present

## 2023-10-04 DIAGNOSIS — O24415 Gestational diabetes mellitus in pregnancy, controlled by oral hypoglycemic drugs: Secondary | ICD-10-CM | POA: Diagnosis present

## 2023-10-04 LAB — COMPREHENSIVE METABOLIC PANEL
ALT: 13 [IU]/L (ref 0–32)
ALT: 16 U/L (ref 0–44)
AST: 16 [IU]/L (ref 0–40)
AST: 17 U/L (ref 15–41)
Albumin: 3.2 g/dL — ABNORMAL LOW (ref 4.0–5.0)
Albumin: 2.2 g/dL — ABNORMAL LOW (ref 3.5–5.0)
Alkaline Phosphatase: 124 [IU]/L — ABNORMAL HIGH (ref 44–121)
Alkaline Phosphatase: 101 U/L (ref 38–126)
Anion gap: 11 (ref 5–15)
BUN/Creatinine Ratio: 14 (ref 9–23)
BUN: 9 mg/dL (ref 6–20)
BUN: 11 mg/dL (ref 6–20)
Bilirubin Total: <0.2 mg/dL (ref 0.0–1.2)
CO2: 19 mmol/L — ABNORMAL LOW (ref 20–29)
CO2: 19 mmol/L — ABNORMAL LOW (ref 22–32)
Calcium: 8.3 mg/dL — ABNORMAL LOW (ref 8.7–10.2)
Calcium: 8.7 mg/dL — ABNORMAL LOW (ref 8.9–10.3)
Chloride: 103 mmol/L (ref 96–106)
Chloride: 106 mmol/L (ref 98–111)
Creatinine, Ser: 0.64 mg/dL (ref 0.57–1.00)
Creatinine, Ser: 0.66 mg/dL (ref 0.44–1.00)
GFR, Estimated: >60 mL/min (ref >60)
Globulin, Total: 2.7 g/dL (ref 1.5–4.5)
Glucose, Bld: 85 mg/dL (ref 70–99)
Glucose: 94 mg/dL (ref 70–99)
Potassium: 4.8 mmol/L (ref 3.5–5.2)
Potassium: 3.8 mmol/L (ref 3.5–5.1)
Sodium: 139 mmol/L (ref 134–144)
Sodium: 136 mmol/L (ref 135–145)
Total Bilirubin: 0.4 mg/dL (ref 0.0–1.2)
Total Protein: 5.9 g/dL — ABNORMAL LOW (ref 6.0–8.5)
Total Protein: 6 g/dL — ABNORMAL LOW (ref 6.5–8.1)
eGFR: 126 mL/min/{1.73_m2} (ref >59)

## 2023-10-04 LAB — CBC
HCT: 32.1 % — ABNORMAL LOW (ref 36.0–46.0)
Hemoglobin: 10.2 g/dL — ABNORMAL LOW (ref 12.0–15.0)
MCH: 26 pg (ref 26.0–34.0)
MCHC: 31.8 g/dL (ref 30.0–36.0)
MCV: 81.9 fL (ref 80.0–100.0)
Platelets: 369 10*3/uL (ref 150–400)
RBC: 3.92 MIL/uL (ref 3.87–5.11)
RDW: 13.8 % (ref 11.5–15.5)
WBC: 13.8 10*3/uL — ABNORMAL HIGH (ref 4.0–10.5)
nRBC: 0 % (ref 0.0–0.2)

## 2023-10-04 LAB — GLUCOSE, CAPILLARY
Glucose-Capillary: 102 mg/dL — ABNORMAL HIGH (ref 70–99)
Glucose-Capillary: 138 mg/dL — ABNORMAL HIGH (ref 70–99)
Glucose-Capillary: 99 mg/dL (ref 70–99)
Glucose-Capillary: 138 mg/dL — ABNORMAL HIGH (ref 70–99)
Glucose-Capillary: 92 mg/dL (ref 70–99)
Glucose-Capillary: 70 mg/dL (ref 70–99)
Glucose-Capillary: 89 mg/dL (ref 70–99)
Glucose-Capillary: 137 mg/dL — ABNORMAL HIGH (ref 70–99)

## 2023-10-04 LAB — PROTEIN / CREATININE RATIO, URINE
Creatinine, Urine: 123.7 mg/dL
Creatinine, Urine: 128 mg/dL
Protein Creatinine Ratio: 0.11 mg/mg{creat} (ref 0.00–0.15)
Protein, Ur: 20.2 mg/dL
Protein/Creat Ratio: 163 mg/g{creat} (ref 0–200)
Total Protein, Urine: 14 mg/dL

## 2023-10-04 LAB — TYPE AND SCREEN
ABO/RH(D): A POS
Antibody Screen: NEGATIVE

## 2023-10-04 LAB — RPR: RPR Ser Ql: NONREACTIVE

## 2023-10-04 LAB — GROUP B STREP BY PCR: Group B strep by PCR: NEGATIVE

## 2023-10-04 MED ORDER — HYDROXYZINE HCL 50 MG PO TABS: 50.0000 mg | ORAL_TABLET | Freq: Four times a day (QID) | ORAL | Status: DC | PRN

## 2023-10-04 MED ORDER — PHENYLEPHRINE 80 MCG/ML (10ML) SYRINGE FOR IV PUSH (FOR BLOOD PRESSURE SUPPORT): 80.0000 ug | PREFILLED_SYRINGE | INTRAVENOUS | Status: DC | PRN

## 2023-10-04 MED ORDER — LIDOCAINE HCL (PF) 1 % IJ SOLN
INTRAMUSCULAR | Status: DC | PRN
  Administered 2023-10-04 (×2): 4 mL via EPIDURAL

## 2023-10-04 MED ORDER — TERBUTALINE SULFATE 1 MG/ML IJ SOLN: 0.2500 mg | Freq: Once | INTRAMUSCULAR | Status: DC | PRN

## 2023-10-04 MED ORDER — LACTATED RINGERS IV SOLN: 500.0000 mL | Freq: Once | INTRAVENOUS | Status: DC

## 2023-10-04 MED ORDER — MISOPROSTOL 50MCG HALF TABLET
50.0000 ug | ORAL_TABLET | Freq: Once | ORAL | Status: AC
  Administered 2023-10-04: 50 ug via ORAL
  Filled 2023-10-04: qty 1

## 2023-10-04 MED ORDER — LIDOCAINE HCL (PF) 1 % IJ SOLN
30.0000 mL | INTRAMUSCULAR | Status: AC | PRN
  Administered 2023-10-05: 30 mL via SUBCUTANEOUS
  Filled 2023-10-04: qty 30

## 2023-10-04 MED ORDER — CALCIUM CARBONATE ANTACID 500 MG PO CHEW
2.0000 | CHEWABLE_TABLET | Freq: Every day | ORAL | Status: DC
  Administered 2023-10-04: 400 mg via ORAL
  Filled 2023-10-04: qty 2

## 2023-10-04 MED ORDER — OXYTOCIN BOLUS FROM INFUSION
333.0000 mL | Freq: Once | INTRAVENOUS | Status: AC
  Administered 2023-10-05: 333 mL via INTRAVENOUS

## 2023-10-04 MED ORDER — LACTATED RINGERS IV SOLN
500.0000 mL | INTRAVENOUS | Status: DC | PRN
Start: 2023-10-04 — End: 2023-10-05

## 2023-10-04 MED ORDER — OXYTOCIN-SODIUM CHLORIDE 30-0.9 UT/500ML-% IV SOLN
1.0000 m[IU]/min | INTRAVENOUS | Status: DC
  Administered 2023-10-04: 2 m[IU]/min via INTRAVENOUS

## 2023-10-04 MED ORDER — EPHEDRINE 5 MG/ML INJ: 10.0000 mg | INTRAVENOUS | Status: DC | PRN

## 2023-10-04 MED ORDER — ONDANSETRON HCL 4 MG/2ML IJ SOLN: 4.0000 mg | Freq: Four times a day (QID) | INTRAMUSCULAR | Status: DC | PRN

## 2023-10-04 MED ORDER — FENTANYL CITRATE (PF) 100 MCG/2ML IJ SOLN: 50.0000 ug | INTRAMUSCULAR | Status: DC | PRN

## 2023-10-04 MED ORDER — MISOPROSTOL 25 MCG QUARTER TABLET
25.0000 ug | ORAL_TABLET | Freq: Once | ORAL | Status: AC
  Administered 2023-10-04: 25 ug via VAGINAL
  Filled 2023-10-04: qty 1

## 2023-10-04 MED ORDER — SOD CITRATE-CITRIC ACID 500-334 MG/5ML PO SOLN
30.0000 mL | ORAL | Status: DC | PRN
  Administered 2023-10-04: 30 mL via ORAL
  Filled 2023-10-04: qty 30

## 2023-10-04 MED ORDER — METFORMIN HCL 500 MG PO TABS
1000.0000 mg | ORAL_TABLET | Freq: Every day | ORAL | Status: DC
  Administered 2023-10-04 – 2023-10-05 (×2): 1000 mg via ORAL
  Filled 2023-10-04 (×3): qty 2

## 2023-10-04 MED ORDER — BUSPIRONE HCL 5 MG PO TABS
5.0000 mg | ORAL_TABLET | Freq: Two times a day (BID) | ORAL | Status: DC
  Administered 2023-10-04 – 2023-10-07 (×6): 5 mg via ORAL
  Filled 2023-10-04 (×11): qty 1

## 2023-10-04 MED ORDER — ACETAMINOPHEN 325 MG PO TABS: 650.0000 mg | ORAL_TABLET | ORAL | Status: DC | PRN

## 2023-10-04 MED ORDER — MISOPROSTOL 50MCG HALF TABLET
50.0000 ug | ORAL_TABLET | ORAL | Status: DC
  Administered 2023-10-04: 50 ug via ORAL
  Filled 2023-10-04: qty 1

## 2023-10-04 MED ORDER — SERTRALINE HCL 50 MG PO TABS
50.0000 mg | ORAL_TABLET | Freq: Every day | ORAL | Status: DC
  Administered 2023-10-04 – 2023-10-06 (×3): 50 mg via ORAL
  Filled 2023-10-04 (×3): qty 1

## 2023-10-04 MED ORDER — DIPHENHYDRAMINE HCL 50 MG/ML IJ SOLN: 12.5000 mg | INTRAMUSCULAR | Status: DC | PRN

## 2023-10-04 MED ORDER — MISOPROSTOL 50MCG HALF TABLET
50.0000 ug | ORAL_TABLET | ORAL | Status: DC
  Administered 2023-10-04: 50 ug via BUCCAL
  Filled 2023-10-04: qty 1

## 2023-10-04 MED ORDER — LACTATED RINGERS IV SOLN: INTRAVENOUS | Status: DC

## 2023-10-04 MED ORDER — FENTANYL-BUPIVACAINE-NACL 0.5-0.125-0.9 MG/250ML-% EP SOLN
12.0000 mL/h | EPIDURAL | Status: DC | PRN
  Administered 2023-10-04: 10.7 mL/h via EPIDURAL
  Filled 2023-10-04: qty 250

## 2023-10-04 MED ORDER — PHENYLEPHRINE 80 MCG/ML (10ML) SYRINGE FOR IV PUSH (FOR BLOOD PRESSURE SUPPORT)
80.0000 ug | PREFILLED_SYRINGE | INTRAVENOUS | Status: DC | PRN
Start: 2023-10-04 — End: 2023-10-05

## 2023-10-04 MED ORDER — FENTANYL CITRATE (PF) 100 MCG/2ML IJ SOLN: 100.0000 ug | INTRAMUSCULAR | Status: DC | PRN

## 2023-10-04 MED ORDER — OXYTOCIN-SODIUM CHLORIDE 30-0.9 UT/500ML-% IV SOLN
2.5000 [IU]/h | INTRAVENOUS | Status: DC
  Administered 2023-10-05: 2.5 [IU]/h via INTRAVENOUS
  Filled 2023-10-04: qty 500

## 2023-10-04 MED ORDER — DEXTROSE 50 % IV SOLN
12.5000 g | Freq: Once | INTRAVENOUS | Status: AC
  Administered 2023-10-04: 12.5 g via INTRAVENOUS
  Filled 2023-10-04: qty 50

## 2023-10-04 MED ORDER — SERTRALINE HCL 50 MG PO TABS
50.0000 mg | ORAL_TABLET | Freq: Every day | ORAL | Status: DC
  Filled 2023-10-04 (×2): qty 1

## 2023-10-04 NOTE — H&P (Addendum)
Angela Blackwell is a 28 y.o. G23P0020 female at [redacted]w[redacted]d by LMP presenting for IOL A2DM and suspected LGA .   Reports active fetal movement, contractions: irregular, vaginal bleeding: none, membranes: intact.  Initiated prenatal care at [facility] at [redacted]w[redacted]d.   Most recent u/s 09/28/23.   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   PTSD (post-traumatic stress disorder) 07/18/2012   Weight loss counseling, encounter for 01/06/2022    Past Surgical History: Past Surgical History:  Procedure Laterality Date   CERVICAL BIOPSY  05/23/2021   LGSIL- pap 04/2021   TONSILLECTOMY  2021   TONSILLECTOMY AND ADENOIDECTOMY     WISDOM TOOTH EXTRACTION      Obstetrical History: OB History     Gravida  3   Para      Term      Preterm      AB  2   Living         SAB  2   IAB      Ectopic      Multiple      Live Births              Social History: Social History   Socioeconomic History   Marital status: Married    Spouse name: Not on file   Number of children: Not on file   Years of education: Not on file   Highest education level: Not on file  Occupational History   Not on file  Tobacco Use   Smoking status: Every Day    Current packs/day: 0.00    Types: Cigarettes    Last attempt to quit: 09/2018    Years since quitting: 5.0   Smokeless tobacco: Former  Building services engineer status: Every Day   Substances: Nicotine  Substance and Sexual Activity   Alcohol use: Not Currently   Drug use: Never   Sexual activity: Yes    Partners: Male   Other Topics Concern   Not on file  Social History Narrative   Marital status/children/pets: Single.    Education/employment: HS grad, employed as IT sales professional     -smoke alarm in the home:Yes     - wears seatbelt: Yes     - Feels safe in their relationships: Yes            Social Drivers of Corporate investment banker Strain: Low Risk  (06/05/2023)   Received from [facility]   Overall Financial Resource Strain (CARDIA)    Difficulty of Paying Living Expenses: Not very hard  Food Insecurity: No Food Insecurity (10/04/2023)   Hunger Vital Sign    Worried About Running Out of Food in the Last Year: Never true    Ran Out of Food in the Last Year: Never true  Transportation Needs: No Transportation Needs (10/04/2023)   PRAPARE - Administrator, Civil Service (Medical): No    Lack of Transportation (Non-Medical): No  Physical Activity: Sufficiently Active (10/28/2021)   Exercise Vital Sign    Days of Exercise per Week: 5 days    Minutes  of Exercise per Session: 30 min  Stress: Not on file  Social Connections: Unknown (05/24/2023)   [facility]   Social Network    Social Network: Not on file    Family History: Family History  Problem Relation Age of Onset   Hyperlipidemia Mother    Miscarriages / Stillbirths Mother        miscarrige   Hypertension Mother    Osteoarthritis Father    Hypertension Father    Diabetes Father    Hyperlipidemia Father    Depression Father    Mental illness Father    Skin cancer Father    ADD / ADHD Half-Brother    PDD Half-Brother    Bipolar disorder Paternal Aunt    Anxiety disorder Paternal Aunt    Anxiety disorder Paternal Aunt    Depression Paternal Aunt    Arthritis Maternal Grandmother    Breast cancer Maternal Grandmother    Arthritis Maternal Grandfather    Prostate cancer Maternal Grandfather    Mental illness Paternal Grandmother    Hypertension Paternal Grandmother    Hyperlipidemia Paternal  Grandmother    Asthma Paternal Grandmother    Arthritis Paternal Grandmother    Diabetes Paternal Grandmother    Depression Paternal Grandmother    Lung cancer Paternal Grandmother        smoker   Cancer Paternal Grandfather        ?   Lung cancer Paternal Grandfather        smoker   Hyperlipidemia Paternal Grandfather    Hearing loss Paternal Grandfather    Hypertension Paternal Grandfather    COPD Paternal Grandfather    Kidney disease Paternal Grandfather    Heart attack Paternal Grandfather     Allergies: Allergies  Allergen Reactions   Hydrocodone-Acetaminophen Other (See Comments)    Anxiety   Oxycodone Other (See Comments)    anxiety    Medications Prior to Admission  Medication Sig Dispense Refill Last Dose/Taking   aspirin EC 81 MG tablet Take 1 tablet (81 mg total) by mouth daily. Swallow whole. 30 tablet 12    aspirin-acetaminophen-caffeine (EXCEDRIN MIGRAINE) 250-250-65 MG tablet Take by mouth every 6 (six) hours as needed for headache.      Blood Glucose Monitoring Suppl DEVI 1 each by Does not apply route in the morning, at noon, and at bedtime. May substitute to any manufacturer covered by patient's insurance. 1 each 0    busPIRone (BUSPAR) 5 MG tablet Take 1 tablet (5 mg total) by mouth 2 (two) times daily. 60 tablet 3    Cyanocobalamin (B-12 PO) Take by mouth.      cyclobenzaprine (FLEXERIL) 10 MG tablet Take 1 tablet (10 mg total) by mouth every 8 (eight) hours as needed for muscle spasms (headaches). 30 tablet 1    MAGNESIUM PO Take by mouth.      metFORMIN (GLUCOPHAGE) 500 MG tablet Take 2 tablets (1,000 mg total) by mouth daily with breakfast. 90 tablet 0    Prenatal MV & Min w/FA-DHA (PRENATAL GUMMIES PO)       sertraline (ZOLOFT) 50 MG tablet Take 1 tablet (50 mg total) by mouth daily. 90 tablet 3     Review of Systems  Pertinent pos/neg as indicated in HPI  Height 5\' 2"  (1.575 m), weight 126.6 kg, last menstrual period 01/18/2023. General  appearance: alert and cooperative Lungs: clear to auscultation bilaterally Heart: regular rate and rhythm Abdomen: gravid, soft, non-tender Extremities: +2 edema   Fetal monitoring:  FHR: 145 bpm, variability: moderate,  Accelerations: Present,  decelerations:  Absent Uterine activity: Frequency: Every 5-7 minutes Irregular   Presentation: cephalic   Prenatal labs: ABO, Rh: A/Positive/-- (08/21 1516) Antibody: Negative (08/21 1516) Rubella: 1.47 (08/21 1516) RPR: Non Reactive (11/19 0827)  HBsAg: Negative (08/21 1516)  HIV: Non Reactive (11/19 0827)  GBS:   Pending 2hr GTT: 88/177/139  Prenatal Transfer Tool  Maternal Diabetes: Yes:  Diabetes Type:  Insulin/Medication controlled Genetic Screening: Abnormal:  Results: Other: Hereditary Spherocytosis by amnio Maternal Ultrasounds/Referrals: Normal Fetal Ultrasounds or other Referrals:  Referred to Materal Fetal Medicine  Maternal Substance Abuse:  No Significant Maternal Medications:  Meds include: Zoloft, buspar, metformin Significant Maternal Lab Results: None  No results found for this or any previous visit (from the past 24 hours).   Assessment:  [redacted]w[redacted]d SIUP  G3P0020  IOL for A2GDM suspected LGA  Cat 1 FHR  GBS  pending  Plan:  Admit to L&D  IV pain meds/epidural prn active labor  IOL start with dual Cytotec, with possibility of cervical foley, repeat Cytotec or pitocin to follow  Anticipate Vaginal Delivery   Plans to Breastfeed  Contraception: Undecided  Circumcision: N/A Baby Girl  Herminio Commons SNM 10/04/2023, 1:36 AM   CNM attestation:  I have seen and examined this patient; I agree with above documentation in the student midwife's note.   Demiya Magno is a 28 y.o. G3P0020 here for IOL due to A2GDM with suspected LGA; also with gHTN  PE: BP 136/84   Pulse 92   Temp 98.5 F (36.9 C) (Oral)   Ht 5\' 2"  (1.575 m)   Wt 126.6 kg   LMP 01/18/2023   BMI 51.07 kg/m  Gen: calm comfortable, NAD Resp:  normal effort, no distress Abd: gravid  ROS, labs, PMH reviewed  Plan: -Admit to Labor and Delivery -Plan cx ripening with dual cytotec to start and either rpt cytotec/cervical foley/Pit to follow -Check CBGs q 4h latent/q2h active -Anticipate vag delivery  Arabella Merles CNM 10/04/2023, 3:56 AM

## 2023-10-04 NOTE — Anesthesia Preprocedure Evaluation (Addendum)
Anesthesia Evaluation  Patient identified by MRN, date of birth, ID band Patient awake    Reviewed: Allergy & Precautions, Patient's Chart, lab work & pertinent test results  Airway Mallampati: III       Dental  (+) Teeth Intact, Dental Advisory Given   Pulmonary Current Smoker and Patient abstained from smoking.   Pulmonary exam normal breath sounds clear to auscultation       Cardiovascular hypertension, Normal cardiovascular exam Rhythm:Regular     Neuro/Psych  PSYCHIATRIC DISORDERS Anxiety Depression    negative neurological ROS     GI/Hepatic Neg liver ROS,GERD  ,,(+)     (-) substance abuse    Endo/Other  diabetes, Well Controlled, Gestational, Oral Hypoglycemic Agents  Class 4 obesity  Renal/GU negative Renal ROS  negative genitourinary   Musculoskeletal negative musculoskeletal ROS (+)    Abdominal  (+) + obese  Peds  Hematology  (+) Blood dyscrasia, anemia   Anesthesia Other Findings   Reproductive/Obstetrics (+) Pregnancy                             Anesthesia Physical Anesthesia Plan  ASA: 3  Anesthesia Plan: Epidural   Post-op Pain Management: Minimal or no pain anticipated   Induction: Intravenous  PONV Risk Score and Plan: Treatment may vary due to age or medical condition  Airway Management Planned: Natural Airway  Additional Equipment: Fetal Monitoring and None  Intra-op Plan:   Post-operative Plan:   Informed Consent: I have reviewed the patients History and Physical, chart, labs and discussed the procedure including the risks, benefits and alternatives for the proposed anesthesia with the patient or authorized representative who has indicated his/her understanding and acceptance.       Plan Discussed with: Anesthesiologist  Anesthesia Plan Comments:        Anesthesia Quick Evaluation

## 2023-10-04 NOTE — Progress Notes (Signed)
Labor Progress Note  Angela Blackwell is a 28 y.o. G3P0020 at [redacted]w[redacted]d presented for IOL poorly controlled A2GDM given suspected LGA.   S: pt resting comfortably, agreeable with check and FB  O:  BP 134/79   Pulse 94   Temp 97.6 F (36.4 C) (Axillary)   Resp 16   Ht 5\' 2"  (1.575 m)   Wt 126.6 kg   LMP 01/18/2023   BMI 51.07 kg/m  EFM:140bpm/Moderate variability/ 15x15 accels/ None decels CAT: 1 Toco: regular, every 4 minutes   CVE: Dilation: 2.5 Effacement (%): 60 Station: -3 Presentation: Vertex Exam by:: Bryttany Tortorelli MD   A&P: 28 y.o. G3P0020 [redacted]w[redacted]d  here for IOL as above  #Labor: Progressing well. Cook placed with 60cc uterine balloon.  Tolerated well, will give buccal cytotec as well.  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

## 2023-10-04 NOTE — Progress Notes (Addendum)
Labor Progress Note  Angela Blackwell is a 28 y.o. G3P0020 at [redacted]w[redacted]d presented for IOL poorly controlled A2GDM given suspected LGA.   S: Pt comfortable with epidural, no concerns at this time.  O:  BP (!) 118/57   Pulse 86   Temp 98.1 F (36.7 C) (Oral)   Resp 16   Ht 5\' 2"  (1.575 m)   Wt 126.6 kg   LMP 01/18/2023   SpO2 100%   BMI 51.07 kg/m  EFM: 135/mod/+a/-d Toco: every 4 min   CVE: Dilation: 6 Effacement (%): 60 Station: -2 Presentation: Vertex Exam by:: Vernard Gambles, RN   A&P: 28 y.o. G3P0020 [redacted]w[redacted]d here for IOL due to A2GDM with suspected LGA infant  #Labor: FB now out, s/p AROM, will start Pitocin 2x2 to improve ctx frequency #Pain: Epidural #FWB: Cat I tracing #GBS presumptive negative   #BMI 51   #A2GDM: Metformin 1g BID, [redacted]w[redacted]d EFW 3743 (8lb4oz) >99%, AC >99%, HC 59%  A1c 5.0 on IOB labs, borderline results on 2h GTT, diagnosed w BS log  - CBGs q2h - Last CBG 92, will hold off on Endotool, but low threshold to start - Discussed risk of shoulder dystocia w pt  #gHTN: PEC labs neg  BP all normotensive most recently  #Hereditary spherocytosis:  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

## 2023-10-04 NOTE — Progress Notes (Signed)
Angela Blackwell is a 28 y.o. G3P0020 at [redacted]w[redacted]d by  admitted for IOL for A2GDM and suspected LGA  Subjective: Patient laying in bed. She said she got some rest. Not feeling any contractions. Agreed to oral Cytotec and agreeable to foley balloon at next check.  Objective: BP (!) 100/50 (BP Location: Right Arm)   Pulse 86   Temp 98.5 F (36.9 C)   Ht 5\' 2"  (1.575 m)   Wt 126.6 kg   LMP 01/18/2023   BMI 51.07 kg/m  No intake/output data recorded. No intake/output data recorded.  FHT:  FHR: 140-150 bpm, variability: moderate,  accelerations:  Present,  decelerations:  Absent UC:   irregular, every 4 minutes, intermittent tracing due to maternal position SVE:   Dilation: 1 Effacement (%): 70 Station: -2 Exam by:: Esther Hardy RN  Labs: Lab Results  Component Value Date   WBC 13.8 (H) 10/04/2023   HGB 10.2 (L) 10/04/2023   HCT 32.1 (L) 10/04/2023   MCV 81.9 10/04/2023   PLT 369 10/04/2023    Assessment / Plan: -Continue IOL with oral cytotec, discussed POC and agreeable to foley balloon at next exam. -Q4h CBG in latent labor. Last CBG 102.  -PIH labs collected and WNL. Continue monitoring BP's  Labor: Progressing normally Fetal Wellbeing:  Category I Pain Control:   Pain medication options PRN Anticipated MOD:  NSVD  Herminio Commons, Student-MidWife 10/04/2023, 5:30 AM

## 2023-10-04 NOTE — Anesthesia Procedure Notes (Signed)
Epidural Patient location during procedure: OB Start time: 10/04/2023 5:35 PM End time: 10/04/2023 5:43 PM  Preanesthetic Checklist Completed: patient identified, IV checked, site marked, risks and benefits discussed, surgical consent, monitors and equipment checked, pre-op evaluation and timeout performed  Epidural Patient position: sitting Prep: DuraPrep and site prepped and draped Patient monitoring: continuous pulse ox and blood pressure Approach: midline Location: L4-L5 Injection technique: LOR air  Needle:  Needle type: Tuohy  Needle gauge: 17 G Needle length: 9 cm and 9 Needle insertion depth: 9 cm Catheter type: closed end flexible Catheter size: 19 Gauge Catheter at skin depth: 15 cm Test dose: negative and Other  Assessment Events: blood not aspirated, no cerebrospinal fluid, injection not painful, no injection resistance, no paresthesia and negative IV test  Additional Notes Patient identified. Risks and benefits discussed including failed block, incomplete  Pain control, post dural puncture headache, nerve damage, paralysis, blood pressure Changes, nausea, vomiting, reactions to medications-both toxic and allergic and post Partum back pain. All questions were answered. Patient expressed understanding and wished to proceed. Sterile technique was used throughout procedure. Epidural site was Dressed with sterile barrier dressing. No paresthesias, signs of intravascular injection Or signs of intrathecal spread were encountered.  Patient was more comfortable after the epidural was dosed. Please see RN's note for documentation of vital signs and FHR which are stable. Reason for block:procedure for pain

## 2023-10-05 ENCOUNTER — Encounter (HOSPITAL_COMMUNITY): Payer: Self-pay | Admitting: Obstetrics & Gynecology

## 2023-10-05 DIAGNOSIS — O3663X0 Maternal care for excessive fetal growth, third trimester, not applicable or unspecified: Secondary | ICD-10-CM

## 2023-10-05 DIAGNOSIS — O24424 Gestational diabetes mellitus in childbirth, insulin controlled: Secondary | ICD-10-CM

## 2023-10-05 DIAGNOSIS — Z3A37 37 weeks gestation of pregnancy: Secondary | ICD-10-CM

## 2023-10-05 DIAGNOSIS — O134 Gestational [pregnancy-induced] hypertension without significant proteinuria, complicating childbirth: Secondary | ICD-10-CM

## 2023-10-05 LAB — GLUCOSE, CAPILLARY
Glucose-Capillary: 90 mg/dL (ref 70–99)
Glucose-Capillary: 88 mg/dL (ref 70–99)
Glucose-Capillary: 102 mg/dL — ABNORMAL HIGH (ref 70–99)
Glucose-Capillary: 129 mg/dL — ABNORMAL HIGH (ref 70–99)
Glucose-Capillary: 144 mg/dL — ABNORMAL HIGH (ref 70–99)

## 2023-10-05 MED ORDER — IBUPROFEN 600 MG PO TABS
600.0000 mg | ORAL_TABLET | Freq: Four times a day (QID) | ORAL | Status: DC
  Administered 2023-10-05 – 2023-10-07 (×6): 600 mg via ORAL
  Filled 2023-10-05 (×7): qty 1

## 2023-10-05 MED ORDER — SENNOSIDES-DOCUSATE SODIUM 8.6-50 MG PO TABS
2.0000 | ORAL_TABLET | Freq: Every day | ORAL | Status: DC
  Administered 2023-10-07: 2 via ORAL
  Filled 2023-10-05 (×2): qty 2

## 2023-10-05 MED ORDER — SIMETHICONE 80 MG PO CHEW: 80.0000 mg | CHEWABLE_TABLET | ORAL | Status: DC | PRN

## 2023-10-05 MED ORDER — ONDANSETRON HCL 4 MG PO TABS: 4.0000 mg | ORAL_TABLET | ORAL | Status: DC | PRN

## 2023-10-05 MED ORDER — INFLUENZA VIRUS VACC SPLIT PF (FLUZONE) 0.5 ML IM SUSY: 0.5000 mL | PREFILLED_SYRINGE | INTRAMUSCULAR | Status: DC

## 2023-10-05 MED ORDER — TRANEXAMIC ACID-NACL 1000-0.7 MG/100ML-% IV SOLN
1000.0000 mg | INTRAVENOUS | Status: AC
  Administered 2023-10-05: 1000 mg via INTRAVENOUS
  Filled 2023-10-05: qty 100

## 2023-10-05 MED ORDER — COCONUT OIL OIL: 1.0000 | TOPICAL_OIL | Status: DC | PRN

## 2023-10-05 MED ORDER — TETANUS-DIPHTH-ACELL PERTUSSIS 5-2.5-18.5 LF-MCG/0.5 IM SUSY: 0.5000 mL | PREFILLED_SYRINGE | Freq: Once | INTRAMUSCULAR | Status: DC

## 2023-10-05 MED ORDER — BENZOCAINE-MENTHOL 20-0.5 % EX AERO
1.0000 | INHALATION_SPRAY | CUTANEOUS | Status: DC | PRN
  Administered 2023-10-05: 1 via TOPICAL
  Filled 2023-10-05 (×2): qty 56

## 2023-10-05 MED ORDER — ACETAMINOPHEN 325 MG PO TABS
650.0000 mg | ORAL_TABLET | ORAL | Status: DC | PRN
  Administered 2023-10-06: 650 mg via ORAL
  Filled 2023-10-05: qty 2

## 2023-10-05 MED ORDER — PRENATAL MULTIVITAMIN CH
1.0000 | ORAL_TABLET | Freq: Every day | ORAL | Status: DC
  Administered 2023-10-06 – 2023-10-07 (×2): 1 via ORAL
  Filled 2023-10-05 (×2): qty 1

## 2023-10-05 MED ORDER — ZOLPIDEM TARTRATE 5 MG PO TABS: 5.0000 mg | ORAL_TABLET | Freq: Every evening | ORAL | Status: DC | PRN

## 2023-10-05 MED ORDER — ONDANSETRON HCL 4 MG/2ML IJ SOLN: 4.0000 mg | INTRAMUSCULAR | Status: DC | PRN

## 2023-10-05 MED ORDER — WITCH HAZEL-GLYCERIN EX PADS: 1.0000 | MEDICATED_PAD | CUTANEOUS | Status: DC | PRN

## 2023-10-05 MED ORDER — DIPHENHYDRAMINE HCL 25 MG PO CAPS: 25.0000 mg | ORAL_CAPSULE | Freq: Four times a day (QID) | ORAL | Status: DC | PRN

## 2023-10-05 MED ORDER — DIBUCAINE (PERIANAL) 1 % EX OINT: 1.0000 | TOPICAL_OINTMENT | CUTANEOUS | Status: DC | PRN

## 2023-10-05 NOTE — Progress Notes (Signed)
Labor Progress Note   On 6 of Pit, contracting every 3-53min. Cx now 7cm. Cat I tracing. Continuing to uptitrate Pitocin.   Last two CBG have been ok.   Sundra Aland, MD OB Fellow, Faculty Practice [facility], Center for [facility]

## 2023-10-05 NOTE — Progress Notes (Signed)
Labor Progress Note  Angela Blackwell is a 28 y.o. G3P0020 at [redacted]w[redacted]d presented for IOL poorly controlled A2GDM given suspected LGA.   S: In to check on pt. She's feeling more pressure in her bottom, feeling pressure w ctx.  O:  BP (!) 142/72   Pulse (!) 102   Temp 98.3 F (36.8 C) (Oral)   Resp 17   Ht 5\' 2"  (1.575 m)   Wt 126.6 kg   LMP 01/18/2023   SpO2 99%   BMI 51.07 kg/m  EFM: 145/mod/+a/-d Toco: every 2-6   CVE: Dilation: 8.5 Effacement (%): 80 Station: -1 Presentation: Vertex Exam by:: Lucianne Muss MD   A&P: 28 y.o. G3P0020 [redacted]w[redacted]d here for IOL due to A2GDM with suspected LGA infant  #Labor: Continuing to uptitrate Pitocin, progressing steadily #Pain: Epidural #FWB: Cat I tracing #GBS presumptive negative   #BMI 51   #A2GDM: Metformin 1g BID, [redacted]w[redacted]d EFW 3743 (8lb4oz) >99%, AC >99%, HC 59%  A1c 5.0 on IOB labs, borderline results on 2h GTT, diagnosed w BS log  - CBGs q2h - Continuing to watch BG closely, discussed Endotool w pt if she continues to have BG > 120 - Discussed risk of shoulder dystocia w pt  #gHTN: PEC labs neg  BP all normotensive most recently  #Hereditary spherocytosis:  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

## 2023-10-05 NOTE — Lactation Note (Signed)
This note was copied from a baby's chart. Lactation Consultation Note  Patient Name: Angela Blackwell [id number] Date: 10/05/2023 Age:26 hours Reason for consult: Initial assessment;Mother's request;Primapara;1st time breastfeeding;Early term 37-38.6wks;Maternal endocrine disorder;Breastfeeding assistance  P1- FOB requested for LC to assist MOB with feeding infant. He also informed LC that MOB wants a DEBP set up. MOB reports that she does not know if she wants to latch infant or be an exclusive pumper. MOB stated that she attempted to latch infant after delivery, but she had a difficult time due to her large breasts, so that is why she is considering exclusive pumping. LC offered to assist with attempting a latch. MOB in agreement. LC placed infant on the left breast in the football hold. After a few attempts, infant latched, but would not suck. Infant was very sleepy and not interested at this time. LC reassured MOB that this is normal and reviewed the first 24 hr birthday nap.  LC then set up the DEBP with size 18 mm flanges. MOB could benefit from size 15/16 flanges for home use. LC reviewed where to purchase these. LC watched MOB pump for 6-7 minutes and noted a little colostrum at the tip of MOB's nipples. LC praised MOB. LC reviewed feeding infant on cue 8-12x in 24 hrs, not allowing infant to go over 3 hrs without a feeding, CDC milk storage guidelines and LC services handout. LC encouraged MOB to call for further assistance as needed.  Maternal Data Has patient been taught Hand Expression?: No Does the patient have breastfeeding experience prior to this delivery?: No  Feeding Mother's Current Feeding Choice: Breast Milk and Formula Nipple Type: Slow - flow  LATCH Score Latch: Too sleepy or reluctant, no latch achieved, no sucking elicited.  Audible Swallowing: None  Type of Nipple: Everted at rest and after stimulation  Comfort (Breast/Nipple): Soft / non-tender  Hold (Positioning):  Full assist, staff holds infant at breast  LATCH Score: 4   Lactation Tools Discussed/Used Tools: Pump;Flanges Flange Size: 18 Breast pump type: Double-Electric Breast Pump;Manual Pump Education: Setup, frequency, and cleaning;Milk Storage Reason for Pumping: MOB request Pumping frequency: 15-20 min every 3 hrs  Interventions Interventions: Breast feeding basics reviewed;Assisted with latch;Breast compression;Adjust position;Support pillows;Position options;Hand pump;DEBP;Education;LC Services brochure  Discharge Discharge Education: Warning signs for feeding baby Pump: DEBP;Personal (Spectra and Medela DEBP)  Consult Status Consult Status: Follow-up Date: 10/06/23 Follow-up type: In-patient    Dema Severin BS, IBCLC 10/05/2023, 10:05 PM

## 2023-10-05 NOTE — Progress Notes (Signed)
Angela Blackwell is a 28 y.o. G3P0020 at [redacted]w[redacted]d admitted for induction of labor due to Gestational diabetes.  Subjective:   Objective: BP (!) 127/98   Pulse (!) 110   Temp 98.6 F (37 C) (Oral)   Resp 16   Ht 5\' 2"  (1.575 m)   Wt 126.6 kg   LMP 01/18/2023   SpO2 98%   BMI 51.07 kg/m  I/O last 3 completed shifts: In: -  Out: 650 [Urine:650] No intake/output data recorded.  FHT:  FHR: 150 bpm, variability: moderate,  accelerations:  Present,  decelerations:  Absent UC:   regular, every 2-3 minutes SVE:   Dilation: 10 Effacement (%): 100 Station: Plus 2 Exam by:: Leftwich-Kirby  Labs: Lab Results  Component Value Date   WBC 13.8 (H) 10/04/2023   HGB 10.2 (L) 10/04/2023   HCT 32.1 (L) 10/04/2023   MCV 81.9 10/04/2023   PLT 369 10/04/2023    Assessment / Plan: Induction of labor due to gestational diabetes,  progressing well on pitocin  Labor:  Pt feeling rectal pressure continuously, and increasing with contractions.  RN to set up table for CNM with Dr Judd Lien in the OR, then begin pushing.  Discussed delivery plan with pt, and plan to have NICU team present for peace of mind given spherocytosis dx, additional staff in room for LGA in anticipation of shoulder dystocia, and TXA given prophylactically to prevent hemorrhage. Pt and s/o state understanding and agree with plan of care.   Preeclampsia:   n/a Fetal Wellbeing:  Category I Pain Control:  Epidural I/D:   GBS presumptive negative on PCR, GBS culture pending  Anticipated MOD:  NSVD  Sharen Counter, CNM 10/05/2023, 10:49 AM

## 2023-10-05 NOTE — Progress Notes (Signed)
Labor Progress Note   Received call from bedside RN that pt has been feeling symptomatic from hypoglycemia for approx half an hour -- CBG checked and was 70. Juice given to pt per protocol. On recheck, pt still feeling symptomatic despite CBG of 89. At this time, pt had told RN that she had tried juice prior to calling out due to symptoms. Given ongoing symptoms, I ordered an half an amp of D50 to be given. Pt reports significant symptomatic relief, and CBG came up to 137. Suspect relative hypoglycemia for pt compounded w limited PO intake causing symptoms. Will cont to check CBG per protocol and if symptomatic.   EFM throughout Cat I.   Plan of care otherwise unchanged. Starting Pitocin.   Sundra Aland, MD OB Fellow, Faculty Practice [facility], Center for [facility]

## 2023-10-05 NOTE — Discharge Summary (Signed)
Postpartum Discharge Summary  Date of Service updated***     Patient Name: Angela Blackwell DOB: [birth year] MRN: [id number]  Date of admission: 10/04/2023 Delivery date:10/05/2023 Delivering provider: Sharen Counter A Date of discharge: 10/06/2023  Admitting diagnosis: Gestational hypertension [O13.9] Intrauterine pregnancy: [redacted]w[redacted]d     Secondary diagnosis:  Principal Problem:   Gestational hypertension Active Problems:   FOB with hereditary spherocytosis   Gestational diabetes mellitus (GDM) controlled on oral hypoglycemic drug, antepartum   Large for gestational age fetus affecting management of mother, antepartum   Shoulder dystocia, delivered   Vaginal delivery  Additional problems: none    Discharge diagnosis: Term Pregnancy Delivered                                              Post partum procedures:{Postpartum procedures:23558} Augmentation: Pitocin, Cytotec, and IP Foley Complications: None  Hospital course: Induction of Labor With Vaginal Delivery   28 y.o. yo [id number] at [redacted]w[redacted]d was admitted to the hospital 10/04/2023 for induction of labor.  Indication for induction: A2 DM.  Patient had an labor course complicated by shoulder dystocia x 45 seconds, resolved with suprapubic pressure and McRoberts. Membrane Rupture Time/Date: 2:58 PM,10/04/2023  Delivery Method:Vaginal, Spontaneous Operative Delivery:N/A Episiotomy: None Lacerations:  Labial Details of delivery can be found in separate delivery note.  Patient had a postpartum course complicated by***. Patient is discharged home 10/06/23.  Newborn Data: Birth date:10/05/2023 Birth time:12:00 PM Gender:Female Living status:Living Apgars:7 ,9  Weight:3580 g  Magnesium Sulfate received: No BMZ received: No Rhophylac:No MMR:No T-DaP:Given prenatally Flu: No RSV Vaccine received: No Transfusion:No  Immunizations received: Immunization History  Administered Date(s) Administered   HPV Quadrivalent 07/17/2012,  09/16/2012, 01/21/2013   Influenza,inj,Quad PF,6+ Mos 10/20/2022   Influenza-Unspecified 06/04/2021   Meningococcal Conjugate 06/07/2015   Moderna Sars-Covid-2 Vaccination 10/23/2019, 11/27/2019, 12/25/2019   Tdap 09/04/2012, 08/07/2023    Physical exam  Vitals:   10/05/23 1850 10/05/23 2139 10/06/23 0320 10/06/23 1534  BP: 124/67 127/81 123/85 121/82  Pulse: 92 86  80  Resp: 20 18 17 18   Temp: 98.1 F (36.7 C) 97.7 F (36.5 C) 97.7 F (36.5 C) 98 F (36.7 C)  TempSrc: Oral   Oral  SpO2: 97%   98%  Weight:      Height:       General: {Exam; general:21111117} Lochia: {Desc; appropriate/inappropriate:30686::"appropriate"} Uterine Fundus: {Desc; firm/soft:30687} Incision: {Exam; incision:21111123} DVT Evaluation: {Exam; dvt:2111122} Labs: Lab Results  Component Value Date   WBC 16.4 (H) 10/06/2023   HGB 10.0 (L) 10/06/2023   HCT 32.3 (L) 10/06/2023   MCV 83.0 10/06/2023   PLT 326 10/06/2023      Latest Ref Rng & Units 10/04/2023    1:00 AM  CMP  Glucose 70 - 99 mg/dL 85   BUN 6 - 20 mg/dL 11   Creatinine 4.33 - 1.00 mg/dL 2.95   Sodium 188 - 416 mmol/L 136   Potassium 3.5 - 5.1 mmol/L 3.8   Chloride 98 - 111 mmol/L 106   CO2 22 - 32 mmol/L 19   Calcium 8.9 - 10.3 mg/dL 8.7   Total Protein 6.5 - 8.1 g/dL 6.0   Total Bilirubin 0.0 - 1.2 mg/dL 0.4   Alkaline Phos 38 - 126 U/L 101   AST 15 - 41 U/L 17   ALT 0 - 44 U/L 16  Edinburgh Score:    10/06/2023    9:35 AM  Inocente Salles Postnatal Depression Scale Screening Tool  I have been able to laugh and see the funny side of things. 0  I have looked forward with enjoyment to things. 0  I have blamed myself unnecessarily when things went wrong. 2  I have been anxious or worried for no good reason. 2  I have felt scared or panicky for no good reason. 0  Things have been getting on top of me. 1  I have been so unhappy that I have had difficulty sleeping. 1  I have felt sad or miserable. 1  I have been so unhappy that  I have been crying. 0  The thought of harming myself has occurred to me. 0  Edinburgh Postnatal Depression Scale Total 7   Edinburgh Postnatal Depression Scale Total: 7   After visit meds:  Allergies as of 10/06/2023       Reactions   Hydrocodone-acetaminophen Other (See Comments)   Anxiety   Oxycodone Other (See Comments)   anxiety     Med Rec must be completed prior to using this [facility]***        Discharge home in stable condition Infant Feeding: {Baby feeding:23562} Infant Disposition:{CHL IP OB HOME WITH VQQVZD:63875} Discharge instruction: per After Visit Summary and Postpartum booklet. Activity: Advance as tolerated. Pelvic rest for 6 weeks.  Diet: {OB IEPP:29518841} Future Appointments: Future Appointments  Date Time Provider Department Center  11/06/2023  1:10 PM Rasch, Harolyn Rutherford, NP [facility]   Follow up Visit:  Message sent to [facility] on 10/04/22:  Please schedule this patient for a In person postpartum visit in 6 weeks with the following provider: Any provider. Additional Postpartum F/U:2 hour GTT at PP visit and BP check 1 week  High risk pregnancy complicated by: GDM and HTN Delivery mode:  Vaginal, Spontaneous Anticipated Birth Control:  Unsure   10/06/2023 Richardson Landry, CNM

## 2023-10-06 LAB — CBC
HCT: 32.3 % — ABNORMAL LOW (ref 36.0–46.0)
Hemoglobin: 10 g/dL — ABNORMAL LOW (ref 12.0–15.0)
MCH: 25.7 pg — ABNORMAL LOW (ref 26.0–34.0)
MCHC: 31 g/dL (ref 30.0–36.0)
MCV: 83 fL (ref 80.0–100.0)
Platelets: 326 10*3/uL (ref 150–400)
RBC: 3.89 MIL/uL (ref 3.87–5.11)
RDW: 14 % (ref 11.5–15.5)
WBC: 16.4 10*3/uL — ABNORMAL HIGH (ref 4.0–10.5)
nRBC: 0 % (ref 0.0–0.2)

## 2023-10-06 LAB — GLUCOSE, CAPILLARY: Glucose-Capillary: 92 mg/dL (ref 70–99)

## 2023-10-06 LAB — CULTURE, BETA STREP (GROUP B ONLY): Strep Gp B Culture: NEGATIVE

## 2023-10-06 NOTE — Anesthesia Postprocedure Evaluation (Signed)
Anesthesia Post Note  Patient: Ludmila Ebarb  Procedure(s) Performed: AN AD HOC LABOR EPIDURAL     Patient location during evaluation: Mother Baby Anesthesia Type: Epidural Level of consciousness: awake and alert Pain management: pain level controlled Vital Signs Assessment: post-procedure vital signs reviewed and stable Respiratory status: spontaneous breathing, nonlabored ventilation and respiratory function stable Cardiovascular status: stable Postop Assessment: no headache, no backache, epidural receding and able to ambulate Anesthetic complications: no   No notable events documented.  Last Vitals:  Vitals:   10/05/23 2139 10/06/23 0320  BP: 127/81 123/85  Pulse: 86   Resp: 18 17  Temp: 36.5 C 36.5 C  SpO2:      Last Pain:  Vitals:   10/06/23 0730  TempSrc:   PainSc: 0-No pain   Pain Goal:                   Graciela Plato

## 2023-10-06 NOTE — Social Work (Signed)
MOB was referred for history of depression/anxiety. * Referral screened out by Clinical Social Worker because none of the following criteria appear to apply: ~ History of anxiety/depression during this pregnancy, or of post-partum depression following prior delivery. ~ Diagnosis of anxiety and/or depression within last 3 years OR * MOB's symptoms currently being treated with medication and/or therapy.  MOB also scored a 7 on the [place] scale which does not warrant CSW intervention.   Please contact the Clinical Social Worker if needs arise, by MOB request.   Jimmy Picket, LCSW Clinical Social Worker

## 2023-10-06 NOTE — Progress Notes (Signed)
Post Partum Day 1 Subjective: no complaints, up ad lib, voiding, tolerating PO, and + flatus  Objective: Blood pressure 123/85, pulse 86, temperature 97.7 F (36.5 C), resp. rate 17, height 5\' 2"  (1.575 m), weight 126.6 kg, last menstrual period 01/18/2023, SpO2 97%, unknown if currently breastfeeding.  Physical Exam:  General: alert, cooperative, and no distress Lochia: appropriate Uterine Fundus: firm Incision: n/a DVT Evaluation: No evidence of DVT seen on physical exam.  Recent Labs    10/04/23 0100 10/06/23 0454  HGB 10.2* 10.0*  HCT 32.1* 32.3*    Assessment/Plan: Plan for discharge tomorrow   LOS: 2 days   Sharen Counter, CNM 10/06/2023, 2:47 PM

## 2023-10-07 MED ORDER — IBUPROFEN 600 MG PO TABS: 600.0000 mg | ORAL_TABLET | Freq: Four times a day (QID) | ORAL | 0 refills | Status: DC

## 2023-10-07 NOTE — Lactation Note (Signed)
This note was copied from a baby's chart. Lactation Consultation Note  Patient Name: Angela Blackwell RUEAV'W Date: 10/07/2023 Age:26 hours Reason for consult: Follow-up assessment;Early term 37-38.6wks;Hyperbilirubinemia  P1- 37 wks- @ 48 hrs of life. Infant formula feeding with LC arrival. Per mom breasts/pumps @ night.Discussed pumping with mom for breast stimulation- protecting milk supply. Discussed expectations of cluster feeding overnight to bring in milk supply- try to pump an extra cycle occasionally. Encouraged mom to keep working on big mouth latch with baby and use EBM or coconut oil after each feed. Discussed cluster feeding overnight/ early morning brings in our milk supply, shared expectations of milk coming in. Highlighted risk of engorgement. Discussed hand pump/express to soften breasts, motrin as anti-inflammatory, and ice packs for 10-20 minutes post feed/pumping if still over-full is the best treatments for inflamed/engorged breasts. Per mom she thinks family will be here for one more over night and hopes to go home tomorrow.  Maternal Data Has patient been taught Hand Expression?: Yes Does the patient have breastfeeding experience prior to this delivery?: No  Feeding Mother's Current Feeding Choice: Breast Milk and Formula   Lactation Tools Discussed/Used Breast pump type: Double-Electric Breast Pump Pump Education: Setup, frequency, and cleaning Reason for Pumping: Encouraged mom to pump if baby is supplementing, currently only pumping @ night  Interventions Interventions: Breast feeding basics reviewed;Hand express;Breast compression;Expressed milk;Coconut oil;Education  Discharge Discharge Education: Engorgement and breast care Pump: Personal (Per mom has a Medela and a Spectra, discussed flange kits for 13-21 mm)  Consult Status Consult Status: Follow-up Date: 10/08/23 Follow-up type: In-patient    [facility] 10/07/2023, 12:04 PM

## 2023-10-08 ENCOUNTER — Ambulatory Visit: Payer: Medicaid Other

## 2023-10-08 ENCOUNTER — Ambulatory Visit (HOSPITAL_COMMUNITY): Payer: Self-pay

## 2023-10-08 NOTE — Lactation Note (Signed)
This note was copied from a baby's chart. Lactation Consultation Note  Patient Name: Angela Blackwell [id number] Date: 10/08/2023 Age:26 hours Reason for consult: Follow-up assessment;Other (Comment);Early term 60-38.6wks;Maternal endocrine disorder;Primapara;1st time breastfeeding (baby's discharge)  P1 mom of 15 hour old infant consulted for discharge education. Baby to discharge today and was recently removed from bili lights. Mom reports that she is now feeling able to get back into pumping knowing that baby's levels are normal. Mom has two electric pumps at home and plans to continue to offer the breast and pump for milk supply increase. Currently she expresses 10ml breast milk and is hopeful for an increase once home. LC provided guidance and education on importance of frequent, effective milk removal, proper flange sizing, adequate hydration and nutrition and STS with baby. Discussed to add in breast massage prior to pumping and to use Spectra pump at home for milk removal. Mom intends to place infant to the breast and supplement as needed. Voiced understanding to continue to follow infant's signs of hunger and pumping frequency. Mom asked about lactation supplements to help with milk production. Provided guidance on galactogogues but recommended waiting for use and if using them to ensure that frequent breast stimulation occurs in order for it to work (case-by-case). Provided education on power pumping as well in case milk transition is still delayed over the next 48 hours. Mom understands and will report via outpatient setting or [facility] for assistance once home.   Maternal Data    Feeding Mother's Current Feeding Choice: Breast Milk and Formula   Interventions Interventions: Coconut oil;Breast feeding basics reviewed;Education;LC Services brochure  Discharge Discharge Education: Engorgement and breast care;Warning signs for feeding baby;Outpatient recommendation (mom desires to call to schedule  OP appt) Pump: Personal (Spectra S2 and Medela Pump in style)  Consult Status Consult Status: Complete Date: 10/08/23    Su Grand 10/08/2023, 10:26 AM

## 2023-10-08 NOTE — Lactation Note (Addendum)
This note was copied from a baby's chart. Lactation Consultation Note  Patient Name: Angela Blackwell [id number] Date: 10/08/2023 Age:26 hours Error note    Su Grand 10/08/2023, 9:44 AM

## 2023-10-12 ENCOUNTER — Ambulatory Visit: Payer: 59 | Admitting: Obstetrics and Gynecology

## 2023-10-13 NOTE — Progress Notes (Signed)
   POSTPARTUM BP CHECK  Subjective:  Angela Blackwell is a 28 y.o. [id number] PPD9 s/p SVD presenting for BP check  Presents for BP check iso gestational hypertension. She was not discharged on antihypertensives or lasix.   Doing well with no complaints. Swelling has nearly resolved.   Requests refill on zoloft and buspar  Objective:   Vitals:   10/17/23 1042  BP: 134/87  Pulse: 82  Weight: 253 lb (114.8 kg)  Height: 5\' 2"  (1.575 m)   General:  Alert, oriented and cooperative. Patient is in no acute distress.  Skin: Skin is warm and dry. No rash noted.   Cardiovascular: Normal heart rate noted  Respiratory: Normal respiratory effort, no problems with respiration noted   Extremities: No significant peripheral edema  Assessment and Plan:  Angela Blackwell is a 28 y.o. with 9 days s/p SVD presenting for BP check  1. Postpartum exam (Primary) 2. Gestational hypertension, antepartum Normotensive no current meds Recommended weekly BP monitoring at home and to call for SBP 140+ or DBP 90+ Refills sent for zoloft and buspar Follow up as scheduled on 3/14 or sooner prn  Future Appointments  Date Time Provider Department Center  11/16/2023  8:10 AM Rasch, Harolyn Rutherford, NP [facility]   Lennart Pall, MD

## 2023-10-17 ENCOUNTER — Encounter: Payer: Self-pay | Admitting: Obstetrics and Gynecology

## 2023-10-17 ENCOUNTER — Ambulatory Visit: Payer: Medicaid Other | Admitting: Obstetrics and Gynecology

## 2023-10-17 DIAGNOSIS — O139 Gestational [pregnancy-induced] hypertension without significant proteinuria, unspecified trimester: Secondary | ICD-10-CM

## 2023-10-17 MED ORDER — BUSPIRONE HCL 5 MG PO TABS: 5.0000 mg | ORAL_TABLET | Freq: Three times a day (TID) | ORAL | 11 refills | Status: DC

## 2023-10-17 MED ORDER — SERTRALINE HCL 50 MG PO TABS: 50.0000 mg | ORAL_TABLET | Freq: Every day | ORAL | 3 refills | Status: DC

## 2023-11-06 ENCOUNTER — Ambulatory Visit: Payer: Medicaid Other | Admitting: Obstetrics and Gynecology

## 2023-11-15 NOTE — Progress Notes (Unsigned)
 Post Partum Visit Note  Angela Blackwell is a 28 y.o. G15P1021 female who presents for a postpartum visit. She is 6 weeks postpartum following a normal spontaneous vaginal delivery.  I have fully reviewed the prenatal and intrapartum course. The delivery was at 37.1 gestational weeks.  Anesthesia: epidural. Postpartum course has been unremarkable. Baby is doing well. Baby is feeding by breast. Bleeding no bleeding. Bowel function is normal. Bladder function is normal. Patient is not sexually active. Contraception method is  undecided . Postpartum depression screening: negative- score 7.   The pregnancy intention screening data noted above was reviewed. Potential methods of contraception were discussed. The patient elected to proceed with No data recorded.   Edinburgh Postnatal Depression Scale - 11/15/23 1458       Edinburgh Postnatal Depression Scale:  In the Past 7 Days   I have been able to laugh and see the funny side of things. 0    I have looked forward with enjoyment to things. 0    I have blamed myself unnecessarily when things went wrong. 1    I have been anxious or worried for no good reason. 2    I have felt scared or panicky for no good reason. 0    Things have been getting on top of me. 0    I have been so unhappy that I have had difficulty sleeping. 0    I have felt sad or miserable. 1    I have been so unhappy that I have been crying. 1    The thought of harming myself has occurred to me. 0    Edinburgh Postnatal Depression Scale Total 5             Health Maintenance Due  Topic Date Due   Pneumococcal Vaccine 38-53 Years old (1 of 2 - PCV) Never done   INFLUENZA VACCINE  04/05/2023    The following portions of the patient's history were reviewed and updated as appropriate: allergies, current medications, past family history, past medical history, past social history, past surgical history, and problem list.  Review of Systems Pertinent items are noted in  HPI.  Objective:  BP 127/85   Pulse 69   Ht 5\' 2"  (1.575 m)   Wt 246 lb (111.6 kg)   LMP 01/18/2023   Breastfeeding Yes   BMI 44.99 kg/m    General:  alert, cooperative, and appears stated age       Assessment:     Normal postpartum exam.   Desires BC, however concerned about weight gain. Discussed possible non hormonal options like copper IUD. She will trial Slynd and monitor weight. She will be in touch if she would like to trial another option.   Plan:   Essential components of care per ACOG recommendations:  1.  Mood and well being: Patient with negative depression screening today. Reviewed local resources for support.  - Patient tobacco use? No.   - hx of drug use? No.    2. Infant care and feeding:  -Patient currently breastmilk feeding? Yes. Discussed returning to work and pumping.  -Social determinants of health (SDOH) reviewed in EPIC. No concerns  3. Sexuality, contraception and birth spacing - Patient does not want a pregnancy in the next year.  Desired family size is unknown.   - Reviewed reproductive life planning. Reviewed contraceptive methods based on pt preferences and effectiveness.  Patient desired Oral Contraceptive today.   - Discussed birth spacing of 18 months  4. Sleep and fatigue -Encouraged family/partner/community support of 4 hrs of uninterrupted sleep to help with mood and fatigue  5. Physical Recovery  - Discussed patients delivery and complications. She describes her labor as good. - Patient had a Vaginal, no problems at delivery. Patient had a 1st degree laceration. Perineal healing reviewed. Patient expressed understanding - Patient has urinary incontinence? No. - Patient is safe to resume physical and sexual activity  6.  Health Maintenance - HM due items addressed Yes - Last pap smear No results found for: "DIAGPAP" Pap smear not done at today's visit.  -Breast Cancer screening indicated? No.   7. Chronic Disease/Pregnancy  Condition follow up: Gestational Diabetes  - PCP follow up  - Rx slynd   ? LSIL on pap in 2022, Mychart message sent for patient to schedule annual with PAP  Venia Carbon, NP Center for [facility], [facility]

## 2023-11-16 ENCOUNTER — Other Ambulatory Visit: Payer: Self-pay | Admitting: Obstetrics and Gynecology

## 2023-11-16 ENCOUNTER — Encounter: Payer: Self-pay | Admitting: Obstetrics and Gynecology

## 2023-11-16 ENCOUNTER — Ambulatory Visit: Payer: Medicaid Other | Admitting: Obstetrics and Gynecology

## 2023-11-16 MED ORDER — SLYND 4 MG PO TABS: 1.0000 | ORAL_TABLET | Freq: Every day | ORAL | 11 refills | Status: DC

## 2023-11-17 LAB — GLUCOSE TOLERANCE, 2 HOURS W/ 1HR
Glucose, 1 hour: 145 mg/dL (ref 70–179)
Glucose, 2 hour: 113 mg/dL (ref 70–152)
Glucose, Fasting: 89 mg/dL (ref 70–91)

## 2024-08-13 ENCOUNTER — Ambulatory Visit (INDEPENDENT_AMBULATORY_CARE_PROVIDER_SITE_OTHER): Admitting: Family Medicine

## 2024-08-13 ENCOUNTER — Encounter: Payer: Self-pay | Admitting: Family Medicine

## 2024-08-13 VITALS — BP 126/78 | HR 93 | Temp 98.1°F | Ht 61.5 in | Wt 249.8 lb

## 2024-08-13 DIAGNOSIS — Z13220 Encounter for screening for lipoid disorders: Secondary | ICD-10-CM | POA: Diagnosis not present

## 2024-08-13 DIAGNOSIS — Z6841 Body Mass Index (BMI) 40.0 and over, adult: Secondary | ICD-10-CM | POA: Diagnosis not present

## 2024-08-13 DIAGNOSIS — H66002 Acute suppurative otitis media without spontaneous rupture of ear drum, left ear: Secondary | ICD-10-CM | POA: Diagnosis not present

## 2024-08-13 DIAGNOSIS — F419 Anxiety disorder, unspecified: Secondary | ICD-10-CM | POA: Insufficient documentation

## 2024-08-13 DIAGNOSIS — Z23 Encounter for immunization: Secondary | ICD-10-CM

## 2024-08-13 DIAGNOSIS — F331 Major depressive disorder, recurrent, moderate: Secondary | ICD-10-CM | POA: Diagnosis not present

## 2024-08-13 DIAGNOSIS — Z131 Encounter for screening for diabetes mellitus: Secondary | ICD-10-CM | POA: Diagnosis not present

## 2024-08-13 DIAGNOSIS — Z0000 Encounter for general adult medical examination without abnormal findings: Secondary | ICD-10-CM | POA: Diagnosis not present

## 2024-08-13 LAB — COMPREHENSIVE METABOLIC PANEL WITH GFR
ALT: 16 U/L (ref 0–35)
AST: 15 U/L (ref 0–37)
Albumin: 4.2 g/dL (ref 3.5–5.2)
Alkaline Phosphatase: 72 U/L (ref 39–117)
BUN: 19 mg/dL (ref 6–23)
CO2: 28 meq/L (ref 19–32)
Calcium: 9.2 mg/dL (ref 8.4–10.5)
Chloride: 103 meq/L (ref 96–112)
Creatinine, Ser: 0.76 mg/dL (ref 0.40–1.20)
GFR: 108.43 mL/min (ref >60.00)
Glucose, Bld: 100 mg/dL — ABNORMAL HIGH (ref 70–99)
Potassium: 4.2 meq/L (ref 3.5–5.1)
Sodium: 138 meq/L (ref 135–145)
Total Bilirubin: 0.3 mg/dL (ref 0.2–1.2)
Total Protein: 6.8 g/dL (ref 6.0–8.3)

## 2024-08-13 LAB — CBC
HCT: 40.4 % (ref 36.0–46.0)
Hemoglobin: 13.7 g/dL (ref 12.0–15.0)
MCHC: 33.9 g/dL (ref 30.0–36.0)
MCV: 85.8 fl (ref 78.0–100.0)
Platelets: 398 K/uL (ref 150.0–400.0)
RBC: 4.71 Mil/uL (ref 3.87–5.11)
RDW: 13.4 % (ref 11.5–15.5)
WBC: 9.6 K/uL (ref 4.0–10.5)

## 2024-08-13 LAB — HEMOGLOBIN A1C: Hgb A1c MFr Bld: 5.3 % (ref 4.6–6.5)

## 2024-08-13 LAB — LIPID PANEL
Cholesterol: 154 mg/dL (ref 0–200)
HDL: 40.9 mg/dL (ref >39.00)
LDL Cholesterol: 92 mg/dL (ref 0–99)
NonHDL: 112.6
Total CHOL/HDL Ratio: 4
Triglycerides: 105 mg/dL (ref 0.0–149.0)
VLDL: 21 mg/dL (ref 0.0–40.0)

## 2024-08-13 LAB — TSH: TSH: 1.38 u[IU]/mL (ref 0.35–5.50)

## 2024-08-13 MED ORDER — DESVENLAFAXINE SUCCINATE ER 50 MG PO TB24: 50.0000 mg | ORAL_TABLET | Freq: Every day | ORAL | 1 refills | Status: AC

## 2024-08-13 MED ORDER — AMOXICILLIN-POT CLAVULANATE 875-125 MG PO TABS: 1.0000 | ORAL_TABLET | Freq: Two times a day (BID) | ORAL | 0 refills | Status: DC

## 2024-08-13 NOTE — Patient Instructions (Addendum)
 Return in about 1 year (around 08/14/2025) for cpe (20 min).  5 week follow up on depression/anxiety      Great to see you today.  I have refilled the medication(s) we provide.   If labs were collected or images ordered, we will inform you of  results once we have received them and reviewed. We will contact you either by echart message, or telephone call.  Please give ample time to the testing facility, and our office to run,  receive and review results. Please do not call inquiring of results, even if you can see them in your chart. We will contact you as soon as we are able. If it has been over 1 week since the test was completed, and you have not yet heard from us , then please call us .    - echart message- for normal results that have been seen by the patient already.   - telephone call: abnormal results or if patient has not viewed results in their echart.  If a referral to a specialist was entered for you, please call us  in 2 weeks if you have not heard from the specialist office to schedule.

## 2024-08-13 NOTE — Progress Notes (Signed)
 Patient ID: Angela Blackwell, female  DOB: [birth year], 28 y.o.   MRN: [id number] Patient Care Team    Relationship Specialty Notifications Start End  Catherine Charlies LABOR, DO PCP - General Family Medicine  10/28/21   Nicholaus Burnard HERO, MD PCP - OBGYN Obstetrics and Gynecology  04/25/23   Dannielle Bouchard, DO Consulting Physician Obstetrics and Gynecology  10/28/21     Chief Complaint  Patient presents with   Annual Exam    Subjective:  Angela Blackwell is a 28 y.o.  Female  present for CPE . All past medical history, surgical history, allergies, family history, immunizations, medications and social history were updated in the electronic medical record today. All recent labs, ED visits and hospitalizations within the last year were reviewed.  Health maintenance:  Colon cancer screen: no fhx. Routine screen at 47 Mammogram: FH present in [place]. Routine screen at 42 Cervical cancer screening: last pap: 2022- requested most recent.  Immunizations: tdap UTD 2024, Influenza completed today (encouraged yearly) Infectious disease screening: HIV completed, Hep C completed DEXA:routine screen Patient has a Dental home. Hospitalizations/ED visits: reviewed  Patient reports she has had left ear pain for approximately 3 days.  [place] she noticed her ear was cracking and popping in had more pain associated.  She denies fevers or chills.  Depression/anxiety Patient reports she has had a very rough year.  She gave birth to her daughter.  She had a complicated pregnancy requiring bed rest with majority of the pregnancy.  She gained over 100 pounds during her pregnancy secondary to needing to be on bedrest.  She had increased stress levels during pregnancy and was started on BuSpar  and Zoloft .  She did not feel that helped very much with her symptoms.  Since giving birth she has had some relationship complications with baby's father, creating more anxiety. Patient has had a history of  depression with anxiety.  She was hospitalized in the past. She has been tried on: Paxil, Prozac, Abilify , Lexapro , Zoloft , BuSpar      08/13/2024    1:28 PM 07/12/2023    1:55 PM 08/09/2022    1:57 PM 11/23/2021    3:47 PM 10/28/2021   11:03 AM  Depression screen PHQ 2/9  Decreased Interest 1 1 0 0 0  Down, Depressed, Hopeless 1 2 0 1 1  PHQ - 2 Score 2 3 0 1 1  Altered sleeping 2 0   0  Tired, decreased energy 2 1   0  Change in appetite 2 0   1  Feeling bad or failure about yourself  1 1   2   Trouble concentrating 2 0   0  Moving slowly or fidgety/restless 0 0   0  Suicidal thoughts 0 0   0  PHQ-9 Score 11 5    4    Difficult doing work/chores Somewhat difficult         Data saved with a previous flowsheet row definition      08/13/2024    1:29 PM 07/12/2023    1:56 PM 10/28/2021   11:03 AM  GAD 7 : Generalized Anxiety Score  Nervous, Anxious, on Edge 2 1 1   Control/stop worrying 2 1 1   Worry too much - different things 2 2 1   Trouble relaxing 2 1 0  Restless 1 0 0  Easily annoyed or irritable 2 1 1   Afraid - awful might happen 1 1 0  Total GAD 7 Score  12 7 4   Anxiety Difficulty Not difficult at all      Immunization History  Administered Date(s) Administered   HPV Quadrivalent 07/17/2012, 09/16/2012, 01/21/2013   Influenza, Seasonal, Injecte, Preservative Fre 08/13/2024   Influenza,inj,Quad PF,6+ Mos 10/20/2022   Influenza-Unspecified 06/04/2021   Meningococcal Conjugate 06/07/2015   Moderna Sars-Covid-2 Vaccination 10/23/2019, 11/27/2019, 12/25/2019, 10/22/2020   Tdap 09/04/2012, 08/07/2023     Past Medical History:  Diagnosis Date   Depression    Family history of glaucoma 04/10/2017   Gestational diabetes mellitus (GDM) controlled on oral hypoglycemic drug, antepartum 08/30/2023   Gestational hypertension 10/04/2023   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   Large for gestational age fetus affecting management of mother, antepartum 08/30/2023   LGSIL on  Pap smear of cervix 04/25/2021   [facility]   MDD (major depressive disorder), recurrent, severe, 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   PTSD (post-traumatic stress disorder) 07/18/2012   Shoulder dystocia, delivered 10/05/2023   Vaginal delivery 10/05/2023   Weight loss counseling, encounter for 01/06/2022   Allergies  Allergen Reactions   Hydrocodone-Acetaminophen  Other (See Comments)    Anxiety   Oxycodone Other (See Comments)    anxiety   Past Surgical History:  Procedure Laterality Date   CERVICAL BIOPSY  05/23/2021   LGSIL- pap 04/2021   TONSILLECTOMY  2021   TONSILLECTOMY AND ADENOIDECTOMY     WISDOM TOOTH EXTRACTION     Family History  Problem Relation Age of Onset   Hyperlipidemia Mother    Miscarriages / Stillbirths Mother        miscarrige   Hypertension Mother    Osteoarthritis Father    Hypertension Father    Diabetes Father    Hyperlipidemia Father    Depression Father    Mental illness Father    Skin cancer Father    ADD / ADHD Half-Brother    PDD Half-Brother    Bipolar disorder Paternal Aunt    Anxiety disorder Paternal Aunt    Anxiety disorder Paternal Aunt    Depression Paternal Aunt    Arthritis Maternal Grandmother    Breast cancer Maternal Grandmother    Arthritis Maternal Grandfather    Prostate cancer Maternal Grandfather    Mental illness Paternal Grandmother    Hypertension Paternal Grandmother    Hyperlipidemia Paternal Grandmother    Asthma Paternal Grandmother    Arthritis Paternal Grandmother    Diabetes Paternal Grandmother    Depression Paternal Grandmother    Lung cancer Paternal Grandmother        smoker   Cancer Paternal Grandfather        ?   Lung cancer Paternal Grandfather        smoker   Hyperlipidemia Paternal Grandfather    Hearing loss Paternal Grandfather    Hypertension Paternal Grandfather    COPD Paternal Grandfather    Kidney disease Paternal Grandfather    Heart attack Paternal Grandfather    Social History    Social History Narrative   Marital status/children/pets: Single.    Education/employment: HS grad, employed as it sales professional     -smoke alarm in the home:Yes     - wears seatbelt: Yes     - Feels safe in their relationships: Yes             Allergies as of 08/13/2024       Reactions   Hydrocodone-acetaminophen  Other (See Comments)   Anxiety   Oxycodone Other (See Comments)   anxiety  Medication List        Accurate as of August 13, 2024  3:54 PM. If you have any questions, ask your nurse or doctor.          STOP taking these medications    busPIRone  5 MG tablet Commonly known as: BUSPAR  Stopped by: Charlies Bellini   ibuprofen  600 MG tablet Commonly known as: ADVIL  Stopped by: Charlies Bellini   PRENATAL GUMMIES PO Stopped by: Charlies Bellini   sertraline  50 MG tablet Commonly known as: ZOLOFT  Stopped by: Charlies Bellini   Slynd  4 MG Tabs Generic drug: Drospirenone  Stopped by: Charlies Bellini       TAKE these medications    amoxicillin -clavulanate 875-125 MG tablet Commonly known as: AUGMENTIN Take 1 tablet by mouth 2 (two) times daily. Started by: Charlies Bellini   desvenlafaxine 50 MG 24 hr tablet Commonly known as: Pristiq Take 1 tablet (50 mg total) by mouth daily. Started by: Charlies Bellini        All past medical history, surgical history, allergies, family history, immunizations andmedications were updated in the EMR today and reviewed under the history and medication portions of their EMR.     No results found for this or any previous visit (from the past 2160 hours).  No results found.   ROS 14 pt review of systems performed and negative (unless mentioned in an HPI)  Objective:  BP 126/78   Pulse 93   Temp 98.1 F (36.7 C)   Ht 5' 1.5 (1.562 m)   Wt 249 lb 12.8 oz (113.3 kg)   LMP 07/22/2024 (Exact Date)   SpO2 99%   Breastfeeding No   BMI 46.43 kg/m  Physical Exam Vitals and nursing note reviewed.  Constitutional:       General: She is not in acute distress.    Appearance: Normal appearance. She is obese. She is not ill-appearing or toxic-appearing.  HENT:     Head: Normocephalic and atraumatic.     Right Ear: Tympanic membrane, ear canal and external ear normal. There is no impacted cerumen.     Left Ear: Ear canal and external ear normal. A middle ear effusion is present. There is no impacted cerumen. Tympanic membrane is erythematous.     Nose: No congestion or rhinorrhea.     Mouth/Throat:     Mouth: Mucous membranes are moist.     Pharynx: Oropharynx is clear. No oropharyngeal exudate or posterior oropharyngeal erythema.  Eyes:     General: No scleral icterus.       Right eye: No discharge.        Left eye: No discharge.     Extraocular Movements: Extraocular movements intact.     Conjunctiva/sclera: Conjunctivae normal.     Pupils: Pupils are equal, round, and reactive to light.  Cardiovascular:     Rate and Rhythm: Normal rate and regular rhythm.     Pulses: Normal pulses.     Heart sounds: Normal heart sounds. No murmur heard.    No friction rub. No gallop.  Pulmonary:     Effort: Pulmonary effort is normal. No respiratory distress.     Breath sounds: Normal breath sounds. No stridor. No wheezing, rhonchi or rales.  Chest:     Chest wall: No tenderness.  Abdominal:     General: Abdomen is flat. Bowel sounds are normal. There is no distension.     Palpations: Abdomen is soft. There is no mass.     Tenderness: There is no abdominal  tenderness. There is no right CVA tenderness, left CVA tenderness, guarding or rebound.     Hernia: No hernia is present.  Musculoskeletal:        General: No swelling, tenderness or deformity. Normal range of motion.     Cervical back: Normal range of motion and neck supple. No rigidity or tenderness.     Right lower leg: No edema.     Left lower leg: No edema.  Lymphadenopathy:     Cervical: No cervical adenopathy.  Skin:    General: Skin is warm and dry.      Coloration: Skin is not jaundiced or pale.     Findings: No bruising, erythema, lesion or rash.  Neurological:     General: No focal deficit present.     Mental Status: She is alert and oriented to person, place, and time. Mental status is at baseline.     Cranial Nerves: No cranial nerve deficit.     Sensory: No sensory deficit.     Motor: No weakness.     Coordination: Coordination normal.     Gait: Gait normal.     Deep Tendon Reflexes: Reflexes normal.  Psychiatric:        Attention and Perception: Attention normal.        Mood and Affect: Mood normal. Affect is tearful.        Speech: Speech normal.        Behavior: Behavior normal.        Thought Content: Thought content normal.        Cognition and Memory: Cognition normal.        Judgment: Judgment normal.     No results found.  Assessment/plan: Payden Freeland is a 28 y.o. female present for CPE with 2 new problems Influenza vaccine needed - Flu vaccine trivalent PF, 6mos and older(Flulaval,Afluria,Fluarix,Fluzone ) Lipid screening/Morbid obesity with BMI of 45.0-49.9, adult (HCC) - Lipid panel Diabetes mellitus screening/Morbid obesity with BMI of 45.0-49.9, adult (HCC) - Hemoglobin A1c  Non-recurrent acute suppurative otitis media of left ear without spontaneous rupture of tympanic membrane Acute otitis media of left ear with 3 days duration. Start Augmentin twice daily NSAIDs for discomfort  Major depressive disorder, recurrent episode, moderate (HCC)/Anxiety Lengthy discussion today surrounding patient's mental health.  She has had a baby within the last year of which she was bedbound for the majority of her pregnancy secondary complications.  She then is having difficulty in the relationship with her baby's father.  She had been treated for depression and anxiety in the past, and started on anxiety medicine during her pregnancy which she stopped because she said it worked very well for her. She would like to wait  on referring to a therapist for now. Start Pristiq 50 mg daily Follow-up in 5 weeks  Routine general medical examination at a health care facility (Primary) Colon cancer screen: no fhx. Routine screen at 47 Mammogram: FH present in [place]. Routine screen at 42 Cervical cancer screening: last pap: 2022- requested most recent.  Immunizations: tdap UTD 2024, Influenza completed today (encouraged yearly) Infectious disease screening: HIV completed, Hep C completed DEXA:routine screen Patient was encouraged to exercise greater than 150 minutes a week. Patient was encouraged to choose a diet filled with fresh fruits and vegetables, and lean meats. AVS provided to patient today for education/recommendation on gender specific health and safety maintenance.  Return in about 1 year (around 08/14/2025) for cpe (20 min). 5 week follow up on depression/anxiety and otitis media  Orders Placed This Encounter  Procedures   Flu vaccine trivalent PF, 6mos and older(Flulaval,Afluria,Fluarix,Fluzone )   CBC   Comprehensive metabolic panel with GFR   Hemoglobin A1c   Lipid panel   TSH   Meds ordered this encounter  Medications   desvenlafaxine (PRISTIQ) 50 MG 24 hr tablet    Sig: Take 1 tablet (50 mg total) by mouth daily.    Dispense:  90 tablet    Refill:  1   amoxicillin -clavulanate (AUGMENTIN) 875-125 MG tablet    Sig: Take 1 tablet by mouth 2 (two) times daily.    Dispense:  14 tablet    Refill:  0   Referral Orders  No referral(s) requested today     Electronically signed by: Charlies Bellini, DO [facility]

## 2024-08-14 ENCOUNTER — Ambulatory Visit: Payer: Self-pay | Admitting: Family Medicine

## 2024-09-17 ENCOUNTER — Ambulatory Visit (INDEPENDENT_AMBULATORY_CARE_PROVIDER_SITE_OTHER): Admitting: Family Medicine

## 2024-09-17 VITALS — BP 122/72 | HR 85 | Temp 98.1°F | Wt 250.0 lb

## 2024-09-17 DIAGNOSIS — F331 Major depressive disorder, recurrent, moderate: Secondary | ICD-10-CM | POA: Diagnosis not present

## 2024-09-17 DIAGNOSIS — F419 Anxiety disorder, unspecified: Secondary | ICD-10-CM

## 2024-09-17 NOTE — Progress Notes (Signed)
 "   Patient ID: Angela Blackwell, female  DOB: [birth year], 29 y.o.   MRN: [id number] Patient Care Team    Relationship Specialty Notifications Start End  Catherine Charlies LABOR, DO PCP - General Family Medicine  10/28/21   Nicholaus Burnard HERO, MD PCP - OBGYN Obstetrics and Gynecology  04/25/23   Dannielle Bouchard, DO Consulting Physician Obstetrics and Gynecology  10/28/21     Chief Complaint  Patient presents with   Depression    Subjective:  [name] is a 29 y.o.  Female  present for follow up in ear pain and depression All past medical history, surgical history, allergies, family history, immunizations, medications and social history were updated in the electronic medical record today. All recent labs, ED visits and hospitalizations within the last year were reviewed.  She reports ear pain is much improved since last visit.  Prior note Patient reports she has had left ear pain for approximately 3 days.  [place] she noticed her ear was cracking and popping in had more pain associated.  She denies fevers or chills.  Depression/anxiety Pt is compliant with pristiq  50 mg every day and feels it is working very well for her. No negative side effects Prior note: Patient reports she has had a very rough year.  She gave birth to her daughter.  She had a complicated pregnancy requiring bed rest with majority of the pregnancy.  She gained over 100 pounds during her pregnancy secondary to needing to be on bedrest.  She had increased stress levels during pregnancy and was started on BuSpar  and Zoloft .  She did not feel that helped very much with her symptoms.  Since giving birth she has had some relationship complications with baby's father, creating more anxiety. Patient has had a history of depression with anxiety.  She was hospitalized in the past. She has been tried on: Paxil, Prozac, Abilify , Lexapro , Zoloft , BuSpar      09/17/2024    1:07 PM 08/13/2024    1:28 PM 07/12/2023     1:55 PM 08/09/2022    1:57 PM 11/23/2021    3:47 PM  Depression screen PHQ 2/9  Decreased Interest 0 1 1 0 0  Down, Depressed, Hopeless 0 1 2 0 1  PHQ - 2 Score 0 2 3 0 1  Altered sleeping 0 2 0    Tired, decreased energy 1 2 1     Change in appetite 1 2 0    Feeling bad or failure about yourself  0 1 1    Trouble concentrating 0 2 0    Moving slowly or fidgety/restless 0 0 0    Suicidal thoughts 0 0 0    PHQ-9 Score 2 11 5      Difficult doing work/chores Not difficult at all Somewhat difficult        Data saved with a previous flowsheet row definition      09/17/2024    1:07 PM 08/13/2024    1:29 PM 07/12/2023    1:56 PM 10/28/2021   11:03 AM  GAD 7 : Generalized Anxiety Score  Nervous, Anxious, on Edge 1 2 1 1   Control/stop worrying 0 2 1 1   Worry too much - different things 0 2 2 1   Trouble relaxing 1 2 1  0  Restless 0 1 0 0  Easily annoyed or irritable 0 2 1 1   Afraid - awful might happen 0 1 1 0  Total GAD 7 Score 2  12 7 4   Anxiety Difficulty Not difficult at all Not difficult at all      Immunization History  Administered Date(s) Administered   HPV Quadrivalent 07/17/2012, 09/16/2012, 01/21/2013   Influenza, Seasonal, Injecte, Preservative Fre 08/13/2024   Influenza,inj,Quad PF,6+ Mos 10/20/2022   Influenza-Unspecified 06/04/2021   Meningococcal Conjugate 06/07/2015   Moderna Sars-Covid-2 Vaccination 10/23/2019, 11/27/2019, 12/25/2019, 10/22/2020   Tdap 09/04/2012, 08/07/2023     Past Medical History:  Diagnosis Date   Depression    Family history of glaucoma 04/10/2017   Gestational diabetes mellitus (GDM) controlled on oral hypoglycemic drug, antepartum 08/30/2023   Gestational hypertension 10/04/2023   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   Large for gestational age fetus affecting management of mother, antepartum 08/30/2023   LGSIL on Pap smear of cervix 04/25/2021   [facility]   MDD (major depressive disorder), recurrent, severe, with psychosis  (HCC) 12/09/2014   PTSD (post-traumatic stress disorder) 07/18/2012   Shoulder dystocia, delivered 10/05/2023   Vaginal delivery 10/05/2023   Weight loss counseling, encounter for 01/06/2022   Allergies  Allergen Reactions   Hydrocodone-Acetaminophen  Other (See Comments)    Anxiety   Oxycodone Other (See Comments)    anxiety   Past Surgical History:  Procedure Laterality Date   CERVICAL BIOPSY  05/23/2021   LGSIL- pap 04/2021   TONSILLECTOMY  2021   TONSILLECTOMY AND ADENOIDECTOMY     WISDOM TOOTH EXTRACTION     Family History  Problem Relation Age of Onset   Hyperlipidemia Mother    Miscarriages / Stillbirths Mother        miscarrige   Hypertension Mother    Osteoarthritis Father    Hypertension Father    Diabetes Father    Hyperlipidemia Father    Depression Father    Mental illness Father    Skin cancer Father    ADD / ADHD Half-Brother    PDD Half-Brother    Bipolar disorder Paternal Aunt    Anxiety disorder Paternal Aunt    Anxiety disorder Paternal Aunt    Depression Paternal Aunt    Arthritis Maternal Grandmother    Breast cancer Maternal Grandmother    Arthritis Maternal Grandfather    Prostate cancer Maternal Grandfather    Mental illness Paternal Grandmother    Hypertension Paternal Grandmother    Hyperlipidemia Paternal Grandmother    Asthma Paternal Grandmother    Arthritis Paternal Grandmother    Diabetes Paternal Grandmother    Depression Paternal Grandmother    Lung cancer Paternal Grandmother        smoker   Cancer Paternal Grandfather        ?   Lung cancer Paternal Grandfather        smoker   Hyperlipidemia Paternal Grandfather    Hearing loss Paternal Grandfather    Hypertension Paternal Grandfather    COPD Paternal Grandfather    Kidney disease Paternal Grandfather    Heart attack Paternal Grandfather    Social History   Social History Narrative   Marital status/children/pets: Single.    Education/employment: HS grad, employed as  it sales professional     -smoke alarm in the home:Yes     - wears seatbelt: Yes     - Feels safe in their relationships: Yes             Allergies as of 09/17/2024       Reactions   Hydrocodone-acetaminophen  Other (See Comments)   Anxiety   Oxycodone Other (See Comments)   anxiety  Medication List        Accurate as of September 17, 2024  1:21 PM. If you have any questions, ask your nurse or doctor.          STOP taking these medications    amoxicillin -clavulanate 875-125 MG tablet Commonly known as: AUGMENTIN  Stopped by: Charlies Bellini, DO       TAKE these medications    desvenlafaxine  50 MG 24 hr tablet Commonly known as: Pristiq  Take 1 tablet (50 mg total) by mouth daily.        All past medical history, surgical history, allergies, family history, immunizations andmedications were updated in the EMR today and reviewed under the history and medication portions of their EMR.       No results found.   ROS 14 pt review of systems performed and negative (unless mentioned in an HPI)  Objective:  BP 122/72   Pulse 85   Temp 98.1 F (36.7 C)   Wt 250 lb (113.4 kg)   SpO2 98%   BMI 46.47 kg/m  Physical Exam Vitals and nursing note reviewed.  Constitutional:      General: She is not in acute distress.    Appearance: Normal appearance. She is normal weight. She is not ill-appearing or toxic-appearing.  HENT:     Head: Normocephalic and atraumatic.     Right Ear: Tympanic membrane, ear canal and external ear normal.     Left Ear: Tympanic membrane, ear canal and external ear normal.  Eyes:     General: No scleral icterus.       Right eye: No discharge.        Left eye: No discharge.     Extraocular Movements: Extraocular movements intact.     Conjunctiva/sclera: Conjunctivae normal.     Pupils: Pupils are equal, round, and reactive to light.  Skin:    Findings: No rash.  Neurological:     Mental Status: She is alert and oriented to person,  place, and time. Mental status is at baseline.     Motor: No weakness.     Coordination: Coordination normal.     Gait: Gait normal.  Psychiatric:        Mood and Affect: Mood normal.        Behavior: Behavior normal.        Thought Content: Thought content normal.        Judgment: Judgment normal.    No results found.  Assessment/plan: Asianna Brundage is a 29 y.o. female present for depression follow-up Non-recurrent acute suppurative otitis media of left ear without spontaneous rupture of tympanic membrane Improved.  Mild effusion still present.  Will follow.   Major depressive disorder, recurrent episode, moderate (HCC)/Anxiety Much improved! Continue Pristiq  50 mg daily  Return in about 20 weeks (around 02/04/2025) for Routine chronic condition follow-up.   No orders of the defined types were placed in this encounter.  No orders of the defined types were placed in this encounter.  Referral Orders  No referral(s) requested today     Electronically signed by: Charlies Bellini, DO [facility] "

## 2024-09-17 NOTE — Patient Instructions (Signed)
 Return in about 24 weeks (around 03/04/2025) for Routine chronic condition follow-up.        Great to see you today.  I have refilled the medication(s) we provide.   If labs were collected or images ordered, we will inform you of  results once we have received them and reviewed. We will contact you either by echart message, or telephone call.  Please give ample time to the testing facility, and our office to run,  receive and review results. Please do not call inquiring of results, even if you can see them in your chart. We will contact you as soon as we are able. If it has been over 1 week since the test was completed, and you have not yet heard from us , then please call us .    - echart message- for normal results that have been seen by the patient already.   - telephone call: abnormal results or if patient has not viewed results in their echart.  If a referral to a specialist was entered for you, please call us  in 2 weeks if you have not heard from the specialist office to schedule.

## 2025-02-04 ENCOUNTER — Ambulatory Visit: Admitting: Family Medicine
